# Patient Record
Sex: Male | Born: 1985 | Race: Black or African American | Hispanic: No | Marital: Single | Smoking: Former smoker
Health system: Southern US, Community
[De-identification: ages and names within clinical notes are randomized; demographics above are authoritative.]

## PROBLEM LIST (undated history)

## (undated) DIAGNOSIS — K219 Gastro-esophageal reflux disease without esophagitis: Secondary | ICD-10-CM

## (undated) DIAGNOSIS — F319 Bipolar disorder, unspecified: Secondary | ICD-10-CM

## (undated) DIAGNOSIS — Z8489 Family history of other specified conditions: Secondary | ICD-10-CM

## (undated) DIAGNOSIS — R569 Unspecified convulsions: Secondary | ICD-10-CM

## (undated) HISTORY — PX: TOOTH EXTRACTION: SUR596

---
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
## 2011-08-02 ENCOUNTER — Emergency Department (HOSPITAL_COMMUNITY)
Admission: EM | Admit: 2011-08-02 | Discharge: 2011-08-02 | Disposition: A | Discharge status: Home / Self Care | Payer: Self-pay | Attending: Emergency Medicine | Admitting: Emergency Medicine

## 2011-08-02 ENCOUNTER — Encounter (HOSPITAL_COMMUNITY): Payer: Self-pay | Admitting: Emergency Medicine

## 2011-08-02 DIAGNOSIS — S0181XA Laceration without foreign body of other part of head, initial encounter: Secondary | ICD-10-CM

## 2011-08-02 DIAGNOSIS — S0180XA Unspecified open wound of other part of head, initial encounter: Secondary | ICD-10-CM | POA: Insufficient documentation

## 2011-08-02 DIAGNOSIS — Y92009 Unspecified place in unspecified non-institutional (private) residence as the place of occurrence of the external cause: Secondary | ICD-10-CM | POA: Insufficient documentation

## 2011-08-02 DIAGNOSIS — W1809XA Striking against other object with subsequent fall, initial encounter: Secondary | ICD-10-CM | POA: Insufficient documentation

## 2011-08-02 MED ORDER — LIDOCAINE-EPINEPHRINE 2 %-1:100000 IJ SOLN
20.0000 mL | Freq: Once | INTRAMUSCULAR | Status: AC
  Administered 2011-08-02: 1 mL via INTRADERMAL

## 2011-08-02 NOTE — ED Notes (Signed)
Pressure applied to 1 cm facial laceration

## 2011-08-02 NOTE — ED Provider Notes (Signed)
History     CSN: [id number]  Arrival date & time 08/02/11  1351   First MD Initiated Contact with Patient 08/02/11 1424      Chief Complaint  Patient presents with  . Facial Laceration    1 cm laceration to area below outer aspect of l/eye. top of cheek. Bleeding controlled with pressure. Pt fell at home one hour ago    (Consider location/radiation/quality/duration/timing/severity/associated sxs/prior treatment) HPI Comments: Patient reports that he tripped and fell against the edge of a door just prior to arrival.  He now has a laceration lateral to his left eye.  He denies headache.  No LOC with the fall.  No nausea or vomiting.  No other symptoms.    Patient is a 26 y.o. male presenting with skin laceration. The history is provided by the patient.  Laceration  The incident occurred 1 to 2 hours ago. The laceration is located on the face. Size: 1.5 cm. The pain is mild. He reports no foreign bodies present. His tetanus status is UTD.    History reviewed. No pertinent past medical history.  History reviewed. No pertinent past surgical history.  Family History  Problem Relation Age of Onset  . Diabetes Mother   . Hypertension Mother   . Diabetes Father   . Hypertension Father     History  Substance Use Topics  . Smoking status: Current Everyday Smoker  . Smokeless tobacco: Not on file  . Alcohol Use: Yes      Review of Systems  Constitutional: Negative for fever and chills.  Eyes: Negative for pain, redness and visual disturbance.  Neurological: Negative for dizziness, syncope, light-headedness and headaches.    Allergies  Augmentin  Home Medications   Current Outpatient Rx  Name Route Sig Dispense Refill  . NAPROXEN SODIUM 220 MG PO TABS Oral Take 220 mg by mouth 2 (two) times daily with a meal.      BP 118/55  Pulse 79  Temp(Src) 98.6 F (37 C) (Oral)  Resp 16  SpO2 100%  Physical Exam  Nursing note and vitals reviewed. Constitutional: He is  oriented to person, place, and time. He appears well-developed and well-nourished. No distress.  HENT:  Head:    Eyes: EOM are normal. Pupils are equal, round, and reactive to light.  Neck: Normal range of motion. Neck supple.  Cardiovascular: Normal rate, regular rhythm and normal heart sounds.   Pulmonary/Chest: Effort normal and breath sounds normal.  Neurological: He is alert and oriented to person, place, and time. No cranial nerve deficit.  Skin: Skin is warm and dry. He is not diaphoretic.  Psychiatric: He has a normal mood and affect.    ED Course  Procedures (including critical care time)  Labs Reviewed - No data to display No results found.   No diagnosis found.  LACERATION REPAIR Performed by: Anne Shutter, Jaquitta Dupriest Authorized by: Anne Shutter, Herbert Seta Consent: Verbal consent obtained. Risks and benefits: risks, benefits and alternatives were discussed Consent given by: patient Patient identity confirmed: provided demographic data Prepped and Draped in normal sterile fashion Wound explored  Laceration Location:   Laceration Length: 1.5 cm No Foreign Bodies seen or palpated  Anesthesia: local infiltration  Local anesthetic: lidocaine 2% with epinephrine  Anesthetic total: 2 ml  Irrigation method: syringe Amount of cleaning: standard  Skin closure: 6-0 nylon  Number of sutures: 3  Technique: simple interrupted  Patient tolerance: Patient tolerated the procedure well with no immediate complications.  MDM  Laceration repaired  without complications.  CN II-XII intact.  Patient instructed to follow up in 4-5 days for suture removal.        Magnus Sinning, PA-C 08/02/11 1636

## 2011-08-04 NOTE — ED Provider Notes (Signed)
Medical screening examination/treatment/procedure(s) were performed by non-physician practitioner and as supervising physician I was immediately available for consultation/collaboration.   Forbes Cellar, MD 08/04/11 1336

## 2012-06-21 ENCOUNTER — Emergency Department (HOSPITAL_BASED_OUTPATIENT_CLINIC_OR_DEPARTMENT_OTHER)
Admission: EM | Admit: 2012-06-21 | Discharge: 2012-06-21 | Disposition: A | Discharge status: Home / Self Care | Payer: Self-pay | Attending: Emergency Medicine | Admitting: Emergency Medicine

## 2012-06-21 ENCOUNTER — Encounter (HOSPITAL_BASED_OUTPATIENT_CLINIC_OR_DEPARTMENT_OTHER): Payer: Self-pay | Admitting: *Deleted

## 2012-06-21 DIAGNOSIS — R079 Chest pain, unspecified: Secondary | ICD-10-CM

## 2012-06-21 DIAGNOSIS — Z720 Tobacco use: Secondary | ICD-10-CM

## 2012-06-21 DIAGNOSIS — R062 Wheezing: Secondary | ICD-10-CM | POA: Insufficient documentation

## 2012-06-21 DIAGNOSIS — Z79899 Other long term (current) drug therapy: Secondary | ICD-10-CM | POA: Insufficient documentation

## 2012-06-21 DIAGNOSIS — Z716 Tobacco abuse counseling: Secondary | ICD-10-CM

## 2012-06-21 DIAGNOSIS — K219 Gastro-esophageal reflux disease without esophagitis: Secondary | ICD-10-CM | POA: Insufficient documentation

## 2012-06-21 DIAGNOSIS — F17200 Nicotine dependence, unspecified, uncomplicated: Secondary | ICD-10-CM | POA: Insufficient documentation

## 2012-06-21 DIAGNOSIS — R0789 Other chest pain: Secondary | ICD-10-CM | POA: Insufficient documentation

## 2012-06-21 DIAGNOSIS — Z7189 Other specified counseling: Secondary | ICD-10-CM | POA: Insufficient documentation

## 2012-06-21 MED ORDER — ALUM & MAG HYDROXIDE-SIMETH 200-200-20 MG/5ML PO SUSP
30.0000 mL | Freq: Once | ORAL | Status: AC
  Administered 2012-06-21: 30 mL via ORAL
  Filled 2012-06-21: qty 30

## 2012-06-21 MED ORDER — PANTOPRAZOLE SODIUM 40 MG PO TBEC
40.0000 mg | DELAYED_RELEASE_TABLET | Freq: Once | ORAL | Status: AC
  Administered 2012-06-21: 40 mg via ORAL
  Filled 2012-06-21: qty 1

## 2012-06-21 MED ORDER — OMEPRAZOLE 20 MG PO CPDR: 20.0000 mg | DELAYED_RELEASE_CAPSULE | Freq: Two times a day (BID) | ORAL | Status: DC

## 2012-06-21 MED ORDER — CALCIUM CARBONATE ANTACID 500 MG PO CHEW: 2.0000 | CHEWABLE_TABLET | Freq: Every day | ORAL | Status: DC

## 2012-06-21 NOTE — ED Provider Notes (Signed)
History     CSN: [id number]  Arrival date & time 06/21/12  1437   First MD Initiated Contact with Patient 06/21/12 1505      Chief Complaint  Patient presents with  . Pleurisy    (Consider location/radiation/quality/duration/timing/severity/associated sxs/prior treatment) HPI Todd Yoder is a 26 y.o. male presenting with left sided chest pressure for one year.  It has worsened acutely over last 3-4 days. Mother and pt say he has had increasing intolerance to certain foods like "pepper".  Wakes with pain in am, and has sour taste in mouth also.  Daily cigarette smoker.  Pressure type pain of 6-7/10 recently is relieved bty belching and eating.  He has been having some "racing of my heart" - "could feel all four of mychambers!" Denies dyspnea, diaphoresis, nausea, vomiting, diarrhea, abdominal pain, dizziness, syncope, fever, chills, headache, sinus pain or pressure, rash, sore throat.  Pt is a singer.  History reviewed. No pertinent past medical history.  History reviewed. No pertinent past surgical history.  Family History  Problem Relation Age of Onset  . Diabetes Mother   . Hypertension Mother   . Diabetes Father   . Hypertension Father     History  Substance Use Topics  . Smoking status: Current Every Day Smoker  . Smokeless tobacco: Not on file  . Alcohol Use: Yes      Review of Systems At least 10pt or greater review of systems completed and are negative except where specified in the HPI.  Allergies  Amoxicillin-pot clavulanate  Home Medications   Current Outpatient Rx  Name  Route  Sig  Dispense  Refill  . NAPROXEN SODIUM 220 MG PO TABS   Oral   Take 220 mg by mouth 2 (two) times daily with a meal.           BP 149/96  Pulse 84  Temp 98.8 F (37.1 C) (Oral)  Resp 16  Ht 6\' 4"  (1.93 m)  Wt 208 lb (94.348 kg)  BMI 25.32 kg/m2  SpO2 100%  Physical Exam  Nursing notes reviewed.  Electronic medical record reviewed. VITAL SIGNS:   Filed  Vitals:   06/21/12 1449  BP: 149/96  Pulse: 84  Temp: 98.8 F (37.1 C)  TempSrc: Oral  Resp: 16  Height: 6\' 4"  (1.93 m)  Weight: 208 lb (94.348 kg)  SpO2: 100%   CONSTITUTIONAL: Awake, oriented, appears non-toxic HENT: Atraumatic, normocephalic, oral mucosa pink and moist, airway patent. Nares patent without drainage. External ears normal, TM's clear BL. EYES: Conjunctiva clear, EOMI, PERRLA NECK: Trachea midline, non-tender, supple CARDIOVASCULAR: Normal heart rate, Normal rhythm, No murmurs, rubs, gallops PULMONARY/CHEST: Clear to auscultation, no rhonchi, or rales. Faint end-expiratory wheezes bilaterally. Symmetrical breath sounds. Non-tender. ABDOMINAL: Non-distended, soft, non-tender - no rebound or guarding.  BS normal. NEUROLOGIC: Non-focal, moving all four extremities, no gross sensory or motor deficits. EXTREMITIES: No clubbing, cyanosis, or edema SKIN: Warm, Dry, No erythema, No rash  ED Course  Procedures (including critical care time)  Date: 06/21/2012  Rate: 85  Rhythm: sinus rhythm w/ sinus arrhythmia  QRS Axis: normal  Intervals: normal  ST/T Wave abnormalities: normal  Conduction Disutrbances: none  Narrative Interpretation: unremarkable, no significant change when compared with prior version 05/07/2003   Labs Reviewed - No data to display No results found.   1. GERD (gastroesophageal reflux disease)   2. Tobacco use   3. Chest pain   4. Wheezes   5. Encounter for smoking cessation counseling  MDM  Todd Yoder is a 26 y.o. male presenting with chest/pain pressure w/ overall clinical presentation c/w GERD, possibly early gastric ulcer.  Does use naproxen also.  Doubt ACS, PERC negative, no PTX, VSS/WNL, pt is non-toxic and in NAD.    Had extensive 10 minute discussion concerning quitting smoking (patient has no PCP and will likely not get one) discussed options for nicotine replacement and things to expect for nicotine withdrawal.  Discussed  strategies to increase his likelihood of quitting smoking.  Will put pt on omeprazole BID, and Tums PRN, I have told him his choice of smoking is likely causing these problems and that he must quit. Follow up with GI in 4-6 weeks, PCP resources given.  I explained the diagnosis and have given explicit precautions to return to the ER including worsening pain or any other new or worsening symptoms. The patient understands and accepts the medical plan as it's been dictated and I have answered their questions. Discharge instructions concerning home care and prescriptions have been given.  The patient is STABLE and is discharged to home in good condition.         Jones Skene, MD 06/21/12 1541

## 2012-06-21 NOTE — ED Notes (Signed)
Pt requesting an albuterol inhaler, Dr. Rulon Abide verbal order to [facility] pharmacy for inhaler.

## 2012-06-21 NOTE — ED Notes (Signed)
MD at bedside. 

## 2012-06-21 NOTE — ED Notes (Signed)
Pt amb to triage with quick steady gait in nad. Pt reports chest soreness x 1 year, worse x last night.

## 2013-12-15 ENCOUNTER — Emergency Department (HOSPITAL_COMMUNITY)
Admission: EM | Admit: 2013-12-15 | Discharge: 2013-12-16 | Disposition: A | Discharge status: Home / Self Care | Payer: BC Managed Care – PPO | Attending: Emergency Medicine | Admitting: Emergency Medicine

## 2013-12-15 DIAGNOSIS — F17200 Nicotine dependence, unspecified, uncomplicated: Secondary | ICD-10-CM | POA: Insufficient documentation

## 2013-12-15 DIAGNOSIS — M25539 Pain in unspecified wrist: Secondary | ICD-10-CM

## 2013-12-15 DIAGNOSIS — IMO0001 Reserved for inherently not codable concepts without codable children: Secondary | ICD-10-CM | POA: Insufficient documentation

## 2013-12-15 DIAGNOSIS — K219 Gastro-esophageal reflux disease without esophagitis: Secondary | ICD-10-CM | POA: Insufficient documentation

## 2013-12-15 DIAGNOSIS — Z79899 Other long term (current) drug therapy: Secondary | ICD-10-CM | POA: Insufficient documentation

## 2013-12-15 DIAGNOSIS — Z880 Allergy status to penicillin: Secondary | ICD-10-CM | POA: Insufficient documentation

## 2013-12-15 HISTORY — DX: Gastro-esophageal reflux disease without esophagitis: K21.9

## 2013-12-15 NOTE — ED Notes (Signed)
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  Pt reports having pain and limited movement in right wrist for the past 2 weeks. Pt right wrist is swollen but has a +2 radial pulse on the injured extremity. Pt denies loss of sensation or numbness.

## 2013-12-16 ENCOUNTER — Encounter (HOSPITAL_COMMUNITY): Payer: Self-pay | Admitting: Emergency Medicine

## 2013-12-16 ENCOUNTER — Emergency Department (HOSPITAL_COMMUNITY): Payer: BC Managed Care – PPO

## 2013-12-16 MED ORDER — TRAMADOL HCL 50 MG PO TABS: 50.0000 mg | ORAL_TABLET | Freq: Four times a day (QID) | ORAL | Status: DC | PRN

## 2013-12-16 MED ORDER — NAPROXEN 500 MG PO TABS: 500.0000 mg | ORAL_TABLET | Freq: Two times a day (BID) | ORAL | Status: DC

## 2013-12-16 MED ORDER — HYDROCODONE-ACETAMINOPHEN 5-325 MG PO TABS
2.0000 | ORAL_TABLET | Freq: Once | ORAL | Status: AC
  Administered 2013-12-16: 2 via ORAL
  Filled 2013-12-16: qty 2

## 2013-12-16 NOTE — ED Provider Notes (Signed)
CSN: [id number]     Arrival date & time 12/15/13  2325 History   First MD Initiated Contact with Patient 12/16/13 0008     Chief Complaint  Patient presents with  . Wrist Pain    (Consider location/radiation/quality/duration/timing/severity/associated sxs/prior Treatment) HPI Comments: 28 year old with complaints of right wrist pain x2 weeks. Patient states that he does a lot of lifting at his job at [employer]. 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 No direct trauma or injury to the area.  Patient is a 28 y.o. male presenting with wrist pain. The history is provided by the patient. No language interpreter was used.  Wrist Pain This is a new problem. Episode onset: 2 weeks ago. The problem occurs constantly. The problem has been gradually worsening. Associated symptoms include arthralgias and myalgias. Pertinent negatives include no fever, numbness or weakness. Exacerbated by: movement, lifting, palpation. He has tried NSAIDs for the symptoms. The treatment provided mild relief.    Past Medical History  Diagnosis Date  . GERD (gastroesophageal reflux disease)    Past Surgical History  Procedure Laterality Date  . Tooth extraction     Family History  Problem Relation Age of Onset  . Diabetes Mother   . Hypertension Mother   . Diabetes Father   . Hypertension Father    History  Substance Use Topics  . Smoking status: Current Every Day Smoker -- 0.25 packs/day    Types: Cigarettes  . Smokeless tobacco: Not on file  . Alcohol Use: Yes     Comment: occasional    Review of Systems  Constitutional: Negative for fever.  Musculoskeletal: Positive for arthralgias and myalgias.  Neurological: Negative for weakness and numbness.  All other systems reviewed and are negative.     Allergies  Amoxicillin-pot clavulanate  Home Medications   Prior to Admission medications   Medication Sig Start Date End  Date Taking? Authorizing Provider  calcium carbonate (TUMS) 500 MG chewable tablet Chew 2 tablets (400 mg of elemental calcium total) by mouth daily. 06/21/12   John-Adam Bonk, MD  naproxen (NAPROSYN) 500 MG tablet Take 1 tablet (500 mg total) by mouth 2 (two) times daily. 12/16/13   Antony MaduraKelly Dijon Cosens, PA-C  naproxen sodium (ANAPROX) 220 MG tablet Take 220 mg by mouth 2 (two) times daily with a meal.    Historical Provider, MD  omeprazole (PRILOSEC) 20 MG capsule Take 1 capsule (20 mg total) by mouth 2 (two) times daily. 06/21/12   John-Adam Bonk, MD  traMADol (ULTRAM) 50 MG tablet Take 1 tablet (50 mg total) by mouth every 6 (six) hours as needed. 12/16/13   Antony MaduraKelly Monifa Blanchette, PA-C   BP 119/77  Pulse 65  Temp(Src) 97.9 F (36.6 C) (Oral)  Resp 20  SpO2 98%  Physical Exam  Nursing note and vitals reviewed. Constitutional: He is oriented to person, place, and time. He appears well-developed and well-nourished. No distress.  Nontoxic/nonseptic appearing  HENT:  Head: Normocephalic and atraumatic.  Eyes: Conjunctivae and EOM are normal. No scleral icterus.  Neck: Normal range of motion.  Cardiovascular: Normal rate, regular rhythm and intact distal pulses.   Distal radial pulse 2+ and right upper extremity. Capillary refill normal in all digits of right hand.  Pulmonary/Chest: Effort normal. No respiratory distress.  Musculoskeletal: Normal range of motion. He exhibits tenderness.  Tenderness to palpation of the right wrist extending to the right thumb. Full passive range  of motion of right wrist. No swelling, crepitus, deformity, or effusions.  Neurological: He is alert and oriented to person, place, and time.  No numbness, tingling or weakness of the affected extremity. Sensation to light touch intact. Patient able to wiggle all fingers. Finger to thumb opposition intact.  Skin: Skin is warm and dry. No rash noted. He is not diaphoretic. No erythema. No pallor.  Psychiatric: He has a normal mood and  affect. His behavior is normal.    ED Course  Procedures (including critical care time) Labs Review Labs Reviewed - No data to display  Imaging Review Dg Wrist Complete Right  12/16/2013   CLINICAL DATA:  No known injury. Heavy lifting. Pain and swelling along the radial side of the wrist.  EXAM: RIGHT WRIST - COMPLETE 3+ VIEW  COMPARISON:  None.  FINDINGS: There is no evidence of fracture or dislocation. There is no evidence of arthropathy or other focal bone abnormality. Soft tissues are unremarkable.  IMPRESSION: Negative.   Electronically Signed   By: Burman Nieves M.D.   On: 12/16/2013 00:49     EKG Interpretation None      MDM   Final diagnoses:  Wrist pain    Uncomplicated right wrist pain. Suspect strain versus tendinitis. Patient neurovascularly intact in physical exam. No gross sensory deficits appreciated. X-ray negative for fracture or dislocation. No evidence of septic joint. Thumb spica splint applied and RICE instructions provided. Will refer to hand specialist for further workup as needed. Return precautions provided and patient agreeable to plan with no unaddressed concerns.  Filed Vitals:   12/15/13 2352  BP: 119/77  Pulse: 65  Temp: 97.9 F (36.6 C)  TempSrc: Oral  Resp: 20  SpO2: 98%       Antony Madura, PA-C 12/16/13 0106

## 2013-12-16 NOTE — Discharge Instructions (Signed)
RICE: Routine Care for Injuries The routine care of many injuries includes Rest, Ice, Compression, and Elevation (RICE). HOME CARE INSTRUCTIONS  Rest is needed to allow your body to heal. Routine activities can usually be resumed when comfortable. Injured tendons and bones can take up to 6 weeks to heal. Tendons are the cord-like structures that attach muscle to bone.  Ice following an injury helps keep the swelling down and reduces pain.  Put ice in a plastic bag.  Place a towel between your skin and the bag.  Leave the ice on for 15-20 minutes, 03-04 times a day. Do this while awake, for the first 24 to 48 hours. After that, continue as directed by your caregiver.  Compression helps keep swelling down. It also gives support and helps with discomfort. If an elastic bandage has been applied, it should be removed and reapplied every 3 to 4 hours. It should not be applied tightly, but firmly enough to keep swelling down. Watch fingers or toes for swelling, bluish discoloration, coldness, numbness, or excessive pain. If any of these problems occur, remove the bandage and reapply loosely. Contact your caregiver if these problems continue.  Elevation helps reduce swelling and decreases pain. With extremities, such as the arms, hands, legs, and feet, the injured area should be placed near or above the level of the heart, if possible. SEEK IMMEDIATE MEDICAL CARE IF:  You have persistent pain and swelling.  You develop redness, numbness, or unexpected weakness.  Your symptoms are getting worse rather than improving after several days. These symptoms may indicate that further evaluation or further X-rays are needed. Sometimes, X-rays may not show a small broken bone (fracture) until 1 week or 10 days later. Make a follow-up appointment with your caregiver. Ask when your X-ray results will be ready. Make sure you get your X-ray results. Document Released: 10/09/2000 Document Revised: 09/19/2011  Document Reviewed: 11/26/2010 Exit[employer] Patient Information 2014 [employer].  

## 2013-12-27 NOTE — ED Provider Notes (Signed)
Medical screening examination/treatment/procedure(s) were performed by non-physician practitioner and as supervising physician I was immediately available for consultation/collaboration.   EKG Interpretation None        Brandt LoosenJulie Manly, MD 12/27/13 [phone]

## 2014-04-02 ENCOUNTER — Emergency Department (HOSPITAL_COMMUNITY)
Admission: EM | Admit: 2014-04-02 | Discharge: 2014-04-02 | Discharge status: Against Medical Advice | Payer: BC Managed Care – PPO | Attending: Emergency Medicine | Admitting: Emergency Medicine

## 2014-04-02 ENCOUNTER — Encounter (HOSPITAL_COMMUNITY): Payer: Self-pay | Admitting: Emergency Medicine

## 2014-04-02 DIAGNOSIS — R079 Chest pain, unspecified: Secondary | ICD-10-CM | POA: Diagnosis present

## 2014-04-02 DIAGNOSIS — F17200 Nicotine dependence, unspecified, uncomplicated: Secondary | ICD-10-CM | POA: Insufficient documentation

## 2014-04-02 LAB — CBC
HCT: 42.1 % (ref 39.0–52.0)
Hemoglobin: 14.1 g/dL (ref 13.0–17.0)
MCH: 29.4 pg (ref 26.0–34.0)
MCHC: 33.5 g/dL (ref 30.0–36.0)
MCV: 87.7 fL (ref 78.0–100.0)
Platelets: 186 10*3/uL (ref 150–400)
RBC: 4.8 MIL/uL (ref 4.22–5.81)
RDW: 13.1 % (ref 11.5–15.5)
WBC: 5.6 10*3/uL (ref 4.0–10.5)

## 2014-04-02 LAB — I-STAT TROPONIN, ED: Troponin i, poc: 0 ng/mL (ref 0.00–0.08)

## 2014-04-02 LAB — BASIC METABOLIC PANEL
Anion gap: 11 (ref 5–15)
BUN: 16 mg/dL (ref 6–23)
CO2: 23 mEq/L (ref 19–32)
Calcium: 8.8 mg/dL (ref 8.4–10.5)
Chloride: 103 mEq/L (ref 96–112)
Creatinine, Ser: 1.15 mg/dL (ref 0.50–1.35)
GFR calc Af Amer: >90 mL/min (ref >90)
GFR calc non Af Amer: 87 mL/min — ABNORMAL LOW (ref >90)
Glucose, Bld: 77 mg/dL (ref 70–99)
Potassium: 4.2 mEq/L (ref 3.7–5.3)
Sodium: 137 mEq/L (ref 137–147)

## 2014-04-02 NOTE — ED Notes (Signed)
Bed: WA06 Expected date:  Expected time:  Means of arrival:  Comments: Pt still in rm

## 2014-04-02 NOTE — ED Notes (Signed)
Called for patient twice with no answer.

## 2014-04-02 NOTE — ED Notes (Signed)
Bed: WA07 Expected date:  Expected time:  Means of arrival:  Comments: Pr still in rm

## 2014-04-02 NOTE — ED Notes (Signed)
Called for pt, no response

## 2014-04-02 NOTE — ED Notes (Signed)
Called pt, no response x 2

## 2014-04-02 NOTE — ED Notes (Signed)
Pt reports he started having chest pain around 12pm after he smoked a cigarette. His mother gave him a muscle relaxer, pain was 8/10, now 5/10. Denies n/v/d.

## 2015-03-02 ENCOUNTER — Encounter (HOSPITAL_COMMUNITY): Payer: Self-pay

## 2015-03-02 ENCOUNTER — Emergency Department (HOSPITAL_COMMUNITY)
Admission: EM | Admit: 2015-03-02 | Discharge: 2015-03-02 | Disposition: A | Discharge status: Home / Self Care | Payer: BLUE CROSS/BLUE SHIELD | Attending: Physician Assistant | Admitting: Physician Assistant

## 2015-03-02 DIAGNOSIS — H109 Unspecified conjunctivitis: Secondary | ICD-10-CM | POA: Insufficient documentation

## 2015-03-02 DIAGNOSIS — Z880 Allergy status to penicillin: Secondary | ICD-10-CM | POA: Diagnosis not present

## 2015-03-02 DIAGNOSIS — H5711 Ocular pain, right eye: Secondary | ICD-10-CM | POA: Diagnosis present

## 2015-03-02 DIAGNOSIS — K219 Gastro-esophageal reflux disease without esophagitis: Secondary | ICD-10-CM | POA: Insufficient documentation

## 2015-03-02 DIAGNOSIS — Z720 Tobacco use: Secondary | ICD-10-CM | POA: Diagnosis not present

## 2015-03-02 DIAGNOSIS — Z791 Long term (current) use of non-steroidal anti-inflammatories (NSAID): Secondary | ICD-10-CM | POA: Diagnosis not present

## 2015-03-02 DIAGNOSIS — Z79899 Other long term (current) drug therapy: Secondary | ICD-10-CM | POA: Diagnosis not present

## 2015-03-02 MED ORDER — POLYMYXIN B-TRIMETHOPRIM 10000-0.1 UNIT/ML-% OP SOLN: 1.0000 [drp] | OPHTHALMIC | Status: DC

## 2015-03-02 NOTE — ED Notes (Signed)
Pt with eye pain and discharge x 1 week.  Pink eye outbreak at job

## 2015-03-02 NOTE — ED Provider Notes (Signed)
CSN: [id number]     Arrival date & time 03/02/15  1522 History  This chart was scribed for non-physician practitioner Sharilyn Sites, PA-C working with Abelino Derrick, MD by Littie Deeds, ED Scribe. This patient was seen in room WTR7/WTR7 and the patient's care was started at 4:22 PM.      Chief Complaint  Patient presents with  . Eye Pain   The history is provided by the patient. No language interpreter was used.   HPI Comments: Todd Yoder is a 29 y.o. male who presents to the Emergency Department complaining of right eye pain and purulent drainage for the past week.  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 He denies any acute visual disturbance.  Patient does not wear glasses or contact lenses. Patient has been cleaning eyes with cold cloths at home.  VSS.  Past Medical History  Diagnosis Date  . GERD (gastroesophageal reflux disease)    Past Surgical History  Procedure Laterality Date  . Tooth extraction     Family History  Problem Relation Age of Onset  . Diabetes Mother   . Hypertension Mother   . Diabetes Father   . Hypertension Father    Social History  Substance Use Topics  . Smoking status: Current Every Day Smoker -- 0.25 packs/day    Types: Cigarettes  . Smokeless tobacco: None  . Alcohol Use: Yes     Comment: occasional    Review of Systems  Eyes: Positive for pain and discharge.  All other systems reviewed and are negative.     Allergies  Amoxicillin-pot clavulanate  Home Medications   Prior to Admission medications   Medication Sig Start Date End Date Taking? Authorizing Provider  calcium carbonate (TUMS) 500 MG chewable tablet Chew 2 tablets (400 mg of elemental calcium total) by mouth daily. 06/21/12   John-Adam Bonk, MD  naproxen (NAPROSYN) 500 MG tablet Take 1 tablet (500 mg total) by mouth 2 (two) times daily. 12/16/13   Antony Madura, PA-C  naproxen sodium (ANAPROX) 220 MG tablet Take 220 mg by mouth 2 (two)  times daily with a meal.    Historical Provider, MD  omeprazole (PRILOSEC) 20 MG capsule Take 1 capsule (20 mg total) by mouth 2 (two) times daily. 06/21/12   John-Adam Bonk, MD  traMADol (ULTRAM) 50 MG tablet Take 1 tablet (50 mg total) by mouth every 6 (six) hours as needed. 12/16/13   Antony Madura, PA-C   BP 151/88 mmHg  Pulse 65  Temp(Src) 98.8 F (37.1 C) (Oral)  Resp 18  SpO2 100%   Physical Exam  Constitutional: He is oriented to person, place, and time. He appears well-developed and well-nourished. No distress.  HENT:  Head: Normocephalic and atraumatic.  Mouth/Throat: Oropharynx is clear and moist.  Eyes: EOM are normal. Pupils are equal, round, and reactive to light. Right conjunctiva is injected.  Slight upper and lower eyelid edema; right conjunctiva injected with crusting noted along upper and lower lash lines; EOMs intact, non-painful  Neck: Normal range of motion. Neck supple.  Cardiovascular: Normal rate, regular rhythm and normal heart sounds.   Pulmonary/Chest: Effort normal and breath sounds normal. No respiratory distress. He has no wheezes.  Musculoskeletal: Normal range of motion.  Neurological: He is alert and oriented to person, place, and time.  Skin: Skin is warm and dry. He is not diaphoretic.  Psychiatric: He has a normal mood and affect.  Nursing note and vitals reviewed.  ED Course  Procedures  DIAGNOSTIC STUDIES: Oxygen Saturation is 100% on room air, normal by my interpretation.    COORDINATION OF CARE: 4:23 PM-Discussed treatment plan which includes antibiotics with patient/guardian at bedside and patient/guardian agreed to plan.    Labs Review Labs Reviewed - No data to display  Imaging Review No results found. I have personally reviewed and evaluated these images and lab results as part of my medical decision-making.   EKG Interpretation None      MDM   Final diagnoses:  Conjunctivitis of right eye   29 year old male here with  right eye pain and drainage for the past week. Known contacts with pinkeye. Patient appears to have the same. No concerning findings for pre-septal or orbital cellulitis.  Will start on course of Polytrim drops. He was given ophthalmology follow-up for any new or worsening symptoms.  Discussed plan with patient, he/she acknowledged understanding and agreed with plan of care.  Return precautions given for new or worsening symptoms.  I personally performed the services described in this documentation, which was scribed in my presence. The recorded information has been reviewed and is accurate.  Garlon Hatchet, PA-C 03/02/15 1641  Courteney Randall An, MD 03/02/15 [phone]

## 2015-03-02 NOTE — Discharge Instructions (Signed)
Take the prescribed medication as directed.  If you begin having any symptoms in the left eye, start using drops on that eye too. Follow-up with eye doctor for any new/worsening symptoms.

## 2015-09-20 ENCOUNTER — Encounter (HOSPITAL_COMMUNITY): Payer: Self-pay | Admitting: Emergency Medicine

## 2015-09-20 ENCOUNTER — Emergency Department (HOSPITAL_COMMUNITY)
Admission: EM | Admit: 2015-09-20 | Discharge: 2015-09-20 | Disposition: A | Discharge status: Home / Self Care | Payer: BLUE CROSS/BLUE SHIELD | Attending: Emergency Medicine | Admitting: Emergency Medicine

## 2015-09-20 DIAGNOSIS — B349 Viral infection, unspecified: Secondary | ICD-10-CM

## 2015-09-20 DIAGNOSIS — Z880 Allergy status to penicillin: Secondary | ICD-10-CM | POA: Insufficient documentation

## 2015-09-20 DIAGNOSIS — Z8719 Personal history of other diseases of the digestive system: Secondary | ICD-10-CM | POA: Insufficient documentation

## 2015-09-20 DIAGNOSIS — F17210 Nicotine dependence, cigarettes, uncomplicated: Secondary | ICD-10-CM | POA: Insufficient documentation

## 2015-09-20 LAB — URINE MICROSCOPIC-ADD ON
Bacteria, UA: NONE SEEN
RBC / HPF: NONE SEEN RBC/hpf (ref 0–5)
WBC, UA: NONE SEEN WBC/hpf (ref 0–5)

## 2015-09-20 LAB — COMPREHENSIVE METABOLIC PANEL
ALT: 19 U/L (ref 17–63)
AST: 27 U/L (ref 15–41)
Albumin: 3.8 g/dL (ref 3.5–5.0)
Alkaline Phosphatase: 53 U/L (ref 38–126)
Anion gap: 10 (ref 5–15)
BUN: 15 mg/dL (ref 6–20)
CO2: 21 mmol/L — ABNORMAL LOW (ref 22–32)
Calcium: 8.8 mg/dL — ABNORMAL LOW (ref 8.9–10.3)
Chloride: 106 mmol/L (ref 101–111)
Creatinine, Ser: 0.88 mg/dL (ref 0.61–1.24)
GFR calc Af Amer: >60 mL/min (ref >60)
GFR calc non Af Amer: >60 mL/min (ref >60)
Glucose, Bld: 111 mg/dL — ABNORMAL HIGH (ref 65–99)
Potassium: 3.9 mmol/L (ref 3.5–5.1)
Sodium: 137 mmol/L (ref 135–145)
Total Bilirubin: 0.3 mg/dL (ref 0.3–1.2)
Total Protein: 7.2 g/dL (ref 6.5–8.1)

## 2015-09-20 LAB — CBC
HCT: 48.7 % (ref 39.0–52.0)
Hemoglobin: 16 g/dL (ref 13.0–17.0)
MCH: 29 pg (ref 26.0–34.0)
MCHC: 32.9 g/dL (ref 30.0–36.0)
MCV: 88.2 fL (ref 78.0–100.0)
Platelets: 144 10*3/uL — ABNORMAL LOW (ref 150–400)
RBC: 5.52 MIL/uL (ref 4.22–5.81)
RDW: 13.3 % (ref 11.5–15.5)
WBC: 10.3 10*3/uL (ref 4.0–10.5)

## 2015-09-20 LAB — URINALYSIS, ROUTINE W REFLEX MICROSCOPIC
Bilirubin Urine: NEGATIVE
Glucose, UA: NEGATIVE mg/dL
Hgb urine dipstick: NEGATIVE
Ketones, ur: 15 mg/dL — AB
Leukocytes, UA: NEGATIVE
Nitrite: NEGATIVE
Protein, ur: 100 mg/dL — AB
Specific Gravity, Urine: 1.038 — ABNORMAL HIGH (ref 1.005–1.030)
pH: 6.5 (ref 5.0–8.0)

## 2015-09-20 LAB — LIPASE, BLOOD: Lipase: 38 U/L (ref 11–51)

## 2015-09-20 MED ORDER — ONDANSETRON 4 MG PO TBDP: 4.0000 mg | ORAL_TABLET | Freq: Three times a day (TID) | ORAL | Status: DC | PRN

## 2015-09-20 MED ORDER — BENZONATATE 100 MG PO CAPS: 100.0000 mg | ORAL_CAPSULE | Freq: Three times a day (TID) | ORAL | Status: DC

## 2015-09-20 MED ORDER — ONDANSETRON 4 MG PO TBDP
4.0000 mg | ORAL_TABLET | Freq: Once | ORAL | Status: AC
  Administered 2015-09-20: 4 mg via ORAL
  Filled 2015-09-20: qty 1

## 2015-09-20 NOTE — ED Notes (Signed)
Per pt, states cold symptoms for 4 days-also having diarrhea and vomiting

## 2015-09-20 NOTE — ED Notes (Signed)
Awake. Verbally responsive. A/O x4. Resp even and unlabored. No audible adventitious breath sounds noted. ABC's intact.  

## 2015-09-20 NOTE — ED Notes (Addendum)
Pt reported abd discomfort, n/v x2 and diarrhea x6 in past 24hrs, fever (100.0) and taken Motrin/Tylenol, nonproductive cough, denies GU symptoms

## 2015-09-20 NOTE — Discharge Instructions (Signed)
Viral Infections °A viral infection can be caused by different types of viruses. Most viral infections are not serious and resolve on their own. However, some infections may cause severe symptoms and may lead to further complications. °SYMPTOMS °Viruses can frequently cause: °· Minor sore throat. °· Aches and pains. °· Headaches. °· Runny nose. °· Different types of rashes. °· Watery eyes. °· Tiredness. °· Cough. °· Loss of appetite. °· Gastrointestinal infections, resulting in nausea, vomiting, and diarrhea. °These symptoms do not respond to antibiotics because the infection is not caused by bacteria. However, you might catch a bacterial infection following the viral infection. This is sometimes called a "superinfection." Symptoms of such a bacterial infection may include: °· Worsening sore throat with pus and difficulty swallowing. °· Swollen neck glands. °· Chills and a high or persistent fever. °· Severe headache. °· Tenderness over the sinuses. °· Persistent overall ill feeling (malaise), muscle aches, and tiredness (fatigue). °· Persistent cough. °· Yellow, green, or brown mucus production with coughing. °HOME CARE INSTRUCTIONS  °· Only take over-the-counter or prescription medicines for pain, discomfort, diarrhea, or fever as directed by your caregiver. °· Drink enough water and fluids to keep your urine clear or pale yellow. Sports drinks can provide valuable electrolytes, sugars, and hydration. °· Get plenty of rest and maintain proper nutrition. Soups and broths with crackers or rice are fine. °SEEK IMMEDIATE MEDICAL CARE IF:  °· You have severe headaches, shortness of breath, chest pain, neck pain, or an unusual rash. °· You have uncontrolled vomiting, diarrhea, or you are unable to keep down fluids. °· You or your child has an oral temperature above 102° F (38.9° C), not controlled by medicine. °· Your baby is older than 3 months with a rectal temperature of 102° F (38.9° C) or higher. °· Your baby is 3  months old or younger with a rectal temperature of 100.4° F (38° C) or higher. °MAKE SURE YOU:  °· Understand these instructions. °· Will watch your condition. °· Will get help right away if you are not doing well or get worse. °  °This information is not intended to replace advice given to you by your health care provider. Make sure you discuss any questions you have with your health care provider. °  °Document Released: 04/06/2005 Document Revised: 09/19/2011 Document Reviewed: 12/03/2014 °Elsevier Interactive Patient Education ©2016 [employer] ° °

## 2015-09-20 NOTE — ED Notes (Signed)
Pt given Ginger ale and tolerated well. Pt denies n/v at this time.

## 2015-09-20 NOTE — ED Provider Notes (Signed)
CSN: [id number]     Arrival date & time 09/20/15  1000 History   First MD Initiated Contact with Patient 09/20/15 1036     Chief Complaint  Patient presents with  . Emesis     (Consider location/radiation/quality/duration/timing/severity/associated sxs/prior Treatment) HPI Comments: 30 year old male who presents with cough, vomiting, and diarrhea. 4 days ago, the patient began having cold symptoms including productive cough, nasal congestion, sore throat, and subjective fevers. 2 days ago, he began having some vomiting and yesterday began having a little episodes of diarrhea. In taking ibuprofen at home for his fevers. He states that his last episode of vomiting was this morning when he choked on some mucus. 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  Patient is a 30 y.o. male presenting with vomiting. The history is provided by the patient.  Emesis   Past Medical History  Diagnosis Date  . GERD (gastroesophageal reflux disease)    Past Surgical History  Procedure Laterality Date  . Tooth extraction     Family History  Problem Relation Age of Onset  . Diabetes Mother   . Hypertension Mother   . Diabetes Father   . Hypertension Father    Social History  Substance Use Topics  . Smoking status: Current Every Day Smoker -- 0.25 packs/day    Types: Cigarettes  . Smokeless tobacco: None  . Alcohol Use: Yes     Comment: occasional    Review of Systems  Gastrointestinal: Positive for vomiting.    10 Systems reviewed and are negative for acute change except as noted in the HPI.   Allergies  Amoxicillin-pot clavulanate  Home Medications   Prior to Admission medications   Medication Sig Start Date End Date Taking? Authorizing Provider  ibuprofen (ADVIL,MOTRIN) 200 MG tablet Take 600 mg by mouth every 6 (six) hours as needed for moderate pain.   Yes Historical Provider, MD  benzonatate (TESSALON) 100 MG capsule Take 1 capsule (100 mg total) by mouth every 8  (eight) hours. 09/20/15   Laurence Spatesachel Morgan Little, MD  ondansetron (ZOFRAN ODT) 4 MG disintegrating tablet Take 1 tablet (4 mg total) by mouth every 8 (eight) hours as needed for nausea or vomiting. 09/20/15   Laurence Spatesachel Morgan Little, MD   BP 134/87 mmHg  Pulse 80  Temp(Src) 98.8 F (37.1 C) (Oral)  Resp 18  SpO2 98% Physical Exam  Constitutional: He is oriented to person, place, and time. He appears well-developed and well-nourished. No distress.  HENT:  Head: Normocephalic and atraumatic.  Nose: Nose normal.  Mouth/Throat: No oropharyngeal exudate.  Moist mucous membranes, erythema of posterior oropharynx   Eyes: Conjunctivae are normal. Pupils are equal, round, and reactive to light.  Neck: Neck supple.  Cardiovascular: Normal rate, regular rhythm and normal heart sounds.   No murmur heard. Pulmonary/Chest: Effort normal and breath sounds normal.  Occasional faint wheeze  Abdominal: Soft. Bowel sounds are normal. He exhibits no distension. There is no tenderness.  Musculoskeletal: He exhibits no edema.  Neurological: He is alert and oriented to person, place, and time.  Fluent speech  Skin: Skin is warm and dry.  Psychiatric: He has a normal mood and affect. Judgment normal.  Nursing note and vitals reviewed.   ED Course  Procedures (including critical care time) Labs Review Labs Reviewed  COMPREHENSIVE METABOLIC PANEL - Abnormal; Notable for the following:    CO2 21 (*)    Glucose, Bld 111 (*)    Calcium 8.8 (*)  All other components within normal limits  CBC - Abnormal; Notable for the following:    Platelets 144 (*)    All other components within normal limits  URINALYSIS, ROUTINE W REFLEX MICROSCOPIC (NOT AT [facility]) - Abnormal; Notable for the following:    Specific Gravity, Urine 1.038 (*)    Ketones, ur 15 (*)    Protein, ur 100 (*)    All other components within normal limits  URINE MICROSCOPIC-ADD ON - Abnormal; Notable for the following:    Squamous Epithelial /  LPF 0-5 (*)    All other components within normal limits  LIPASE, BLOOD    Imaging Review No results found. I have personally reviewed and evaluated these lab results as part of my medical decision-making.   EKG Interpretation None     Medications  ondansetron (ZOFRAN-ODT) disintegrating tablet 4 mg (4 mg Oral Given 09/20/15 1045)    MDM   Final diagnoses:  Viral syndrome   Pt p/w several days of cold symptoms as well as vomiting and diarrhea. He was well-appearing on exam with normal vital signs. Reassuring exam with no evidence of dehydration and no respiratory distress. Symptoms are consistent with a viral process. His lab work is unremarkable and shows normal WBC count. Gave the patient oral Zofran after which he was PO challenged. Discussed supportive care including continued hydration, Tylenol/Motrin as needed, and provided with prescription for Tessalon for his cough. Return precautions reviewed and patient discharged in satisfactory condition.  Laurence Spates, MD 09/20/15 1300

## 2015-09-28 ENCOUNTER — Encounter (HOSPITAL_BASED_OUTPATIENT_CLINIC_OR_DEPARTMENT_OTHER): Payer: Self-pay | Admitting: Emergency Medicine

## 2016-05-27 ENCOUNTER — Emergency Department (HOSPITAL_COMMUNITY)
Admission: EM | Admit: 2016-05-27 | Discharge: 2016-05-27 | Disposition: A | Discharge status: Home / Self Care | Payer: BLUE CROSS/BLUE SHIELD | Attending: Emergency Medicine | Admitting: Emergency Medicine

## 2016-05-27 ENCOUNTER — Encounter (HOSPITAL_COMMUNITY): Payer: Self-pay | Admitting: Emergency Medicine

## 2016-05-27 DIAGNOSIS — Y9302 Activity, running: Secondary | ICD-10-CM | POA: Insufficient documentation

## 2016-05-27 DIAGNOSIS — Y999 Unspecified external cause status: Secondary | ICD-10-CM | POA: Insufficient documentation

## 2016-05-27 DIAGNOSIS — Y929 Unspecified place or not applicable: Secondary | ICD-10-CM | POA: Insufficient documentation

## 2016-05-27 DIAGNOSIS — F17210 Nicotine dependence, cigarettes, uncomplicated: Secondary | ICD-10-CM | POA: Insufficient documentation

## 2016-05-27 DIAGNOSIS — Z79899 Other long term (current) drug therapy: Secondary | ICD-10-CM | POA: Insufficient documentation

## 2016-05-27 DIAGNOSIS — W268XXA Contact with other sharp object(s), not elsewhere classified, initial encounter: Secondary | ICD-10-CM | POA: Insufficient documentation

## 2016-05-27 DIAGNOSIS — S51811A Laceration without foreign body of right forearm, initial encounter: Secondary | ICD-10-CM | POA: Insufficient documentation

## 2016-05-27 MED ORDER — BACITRACIN ZINC 500 UNIT/GM EX OINT
TOPICAL_OINTMENT | Freq: Once | CUTANEOUS | Status: AC
  Administered 2016-05-27: 1 via TOPICAL
  Filled 2016-05-27: qty 0.9

## 2016-05-27 MED ORDER — LIDOCAINE-EPINEPHRINE (PF) 2 %-1:200000 IJ SOLN
20.0000 mL | Freq: Once | INTRAMUSCULAR | Status: AC
  Administered 2016-05-27: 20 mL
  Filled 2016-05-27: qty 20

## 2016-05-27 NOTE — Discharge Instructions (Signed)
[facility] to [facility]  ([address] [place] [id number] [phone]) in 10 days for suture removal and recheck of sutures for evidence of delayed wound closure or infection. Read attached documents for more information.  How long will I have my wound closure? Leave adhesive glue on your skin until the glue peels away. Leave adhesive strips on your skin until the strips fall off. Absorbable sutures will dissolve within several days. Nonabsorbable sutures and staples must be removed. The location of the wound will determine how long they stay in. This can range from several days to a couple of weeks. When should I seek help for my wound closure? Contact your health care provider if: You have a fever. You have chills. You have drainage, redness, swelling, or pain at your wound. There is a bad smell coming from your wound. The skin edges of your wound start to separate after your sutures have been removed. Your wound becomes thick, raised, and darker in color after your sutures come out (scarring).

## 2016-05-27 NOTE — ED Notes (Signed)
Bed: WTR7 Expected date:  Expected time:  Means of arrival:  Comments: 

## 2016-05-27 NOTE — ED Provider Notes (Signed)
[facility]-EMERGENCY DEPT Provider Note   CSN: [id number] Arrival date & time: 05/27/16  1200   By signing my name below, I, Todd Yoder, attest that this documentation has been prepared under the direction and in the presence of Sandee Bernath, [place]C. Electronically Signed: Teofilo PodMatthew P. Yoder, ED Scribe. 05/27/2016. 1:03 PM.   History   Chief Complaint Chief Complaint  Patient presents with  . Extremity Laceration   The history is provided by the patient. No language interpreter was used.   HPI Comments:  Todd Yoder is a 30 y.o. male who presents to the Emergency Department complaining of a laceration to his right forearm that he sustained at 1000 this morning. Pt reports that he was running up the stairs, fell, and cut his right forearm on a sharp part of the railing. Pt states that the laceration is "very deep." Pt does not believe that he lost a significant amount of blood. Pt states that the area is mildly painful. Tetanus is UTD. Pt did not clean the wound, but wrapped the wound with coban and a dish rag and was able to control the bleeding. Pt took ibuprofen with mild relief for pain. Pt denies chest pain, SOB, nausea, vomiting, chills.   Past Medical History:  Diagnosis Date  . GERD (gastroesophageal reflux disease)     There are no active problems to display for this patient.   Past Surgical History:  Procedure Laterality Date  . TOOTH EXTRACTION         Home Medications    Prior to Admission medications   Medication Sig Start Date End Date Taking? Authorizing Provider  benzonatate (TESSALON) 100 MG capsule Take 1 capsule (100 mg total) by mouth every 8 (eight) hours. 09/20/15   Laurence Spatesachel Morgan Little, MD  ibuprofen (ADVIL,MOTRIN) 200 MG tablet Take 600 mg by mouth every 6 (six) hours as needed for moderate pain.    Historical Provider, MD  ondansetron (ZOFRAN ODT) 4 MG disintegrating tablet Take 1 tablet (4 mg total) by mouth every 8 (eight) hours as needed  for nausea or vomiting. 09/20/15   Laurence Spatesachel Morgan Little, MD    Family History Family History  Problem Relation Age of Onset  . Diabetes Mother   . Hypertension Mother   . Diabetes Father   . Hypertension Father     Social History Social History  Substance Use Topics  . Smoking status: Current Every Day Smoker    Packs/day: 0.25    Types: Cigarettes  . Smokeless tobacco: Not on file  . Alcohol use Yes     Comment: occasional     Allergies   Amoxicillin-pot clavulanate   Review of Systems Review of Systems  Constitutional: Negative for chills and fever.  Respiratory: Negative for chest tightness and shortness of breath.   Cardiovascular: Negative for chest pain.  Gastrointestinal: Negative for abdominal distention, abdominal pain, nausea and vomiting.  Genitourinary: Negative for difficulty urinating.  Musculoskeletal: Negative for arthralgias and myalgias.  Skin: Positive for wound (right forearm).  All other systems reviewed and are negative.    Physical Exam Updated Vital Signs BP 162/92 (BP Location: Left Arm)   Pulse 73   Temp 98.7 F (37.1 C) (Oral)   Resp 18   Wt 93 kg   SpO2 100%   BMI 24.95 kg/m   Physical Exam  Constitutional: He is oriented to person, place, and time. He appears well-developed and well-nourished. No distress.  HENT:  Head: Normocephalic and atraumatic.  Eyes: Conjunctivae are  normal. Pupils are equal, round, and reactive to light.  Neck: Normal range of motion. Neck supple.  Cardiovascular: Normal rate.   Pulmonary/Chest: Effort normal and breath sounds normal.  Abdominal: He exhibits no distension.  Musculoskeletal: Normal range of motion.  Deep Laceration on right forearm  Neurological: He is alert and oriented to person, place, and time. No cranial nerve deficit.  Skin: Skin is warm and dry.  Psychiatric: He has a normal mood and affect. His behavior is normal.  Nursing note and vitals reviewed.    ED Treatments /  Results  DIAGNOSTIC STUDIES:  Oxygen Saturation is 100% on RA, normal by my interpretation.    COORDINATION OF CARE:  1:03 PM Discussed treatment plan with pt at bedside and pt agreed to plan.   Labs (all labs ordered are listed, but only abnormal results are displayed) Labs Reviewed - No data to display  EKG  EKG Interpretation None       Radiology No results found.  Procedures .Marland KitchenLaceration Repair Date/Time: 05/27/2016 2:54 PM Performed by: Alvina Chou Authorized by: Alvina Chou   Consent:    Consent obtained:  Verbal   Consent given by:  Patient   Risks discussed:  Infection, pain, poor wound healing and poor cosmetic result   Alternatives discussed:  No treatment Anesthesia (see MAR for exact dosages):    Anesthesia method:  Local infiltration   Local anesthetic:  Lidocaine 2% WITH epi Laceration details:    Location:  Shoulder/arm   Shoulder/arm location:  R lower arm   Length (cm):  10 Repair type:    Repair type:  Intermediate Pre-procedure details:    Preparation:  Patient was prepped and draped in usual sterile fashion Exploration:    Hemostasis achieved with:  Epinephrine   Wound exploration: wound explored through full range of motion and entire depth of wound probed and visualized     Wound extent: areolar tissue violated, fascia violated and muscle damage     Wound extent: no foreign bodies/material noted, no nerve damage noted, no tendon damage noted, no underlying fracture noted and no vascular damage noted     Contaminated: no   Treatment:    Area cleansed with:  Betadine and saline   Amount of cleaning:  Extensive   Irrigation solution:  Sterile saline   Irrigation volume:  2 L   Irrigation method:  Pressure wash   Visualized foreign bodies/material removed: no   Fascia repair:    Suture size:  5-0   Suture material:  Vicryl   Suture technique:  Simple interrupted   Number of sutures:  3 Skin repair:    Repair  method:  Sutures   Suture size:  4-0   Wound skin closure material used: Ethilon.   Suture technique:  Simple interrupted (7 simple interupted, 1 horizontal mattress)   Number of sutures:  8 (7 simple interupted, 1 horizontal mattress) Approximation:    Approximation:  Close   Vermilion border: well-aligned   Post-procedure details:    Dressing:  Antibiotic ointment and bulky dressing   Patient tolerance of procedure:  Tolerated well, no immediate complications   (including critical care time)  Medications Ordered in ED Medications  lidocaine-EPINEPHrine (XYLOCAINE W/EPI) 2 %-1:200000 (PF) injection 20 mL (not administered)  bacitracin ointment (not administered)     Initial Impression / Assessment and Plan / ED Course  I have reviewed the triage vital signs and the nursing notes.  Pertinent labs & imaging results that were  available during my care of the patient were reviewed by me and considered in my medical decision making (see chart for details).  Clinical Course   Patient is a 30y/o male that presents with laceration to right forearm. Tdap booster UTD. Pressure irrigation performed. Bottom of the wound visualized with bleeding controled. Laceration occurred < 8 hours prior to repair which was well tolerated. Pt has no co morbidities to effect normal wound healing. Discussed suture home care w pt and answered questions. Pt to f-u for wound check and suture removal in 10 days. Pt is hemodynamically stable w no complaints prior to dc.   BP improved to 134/94 at discharge.    Final Clinical Impressions(s) / ED Diagnoses   Final diagnoses:  Laceration of right forearm, initial encounter    New Prescriptions New Prescriptions   No medications on file  I personally performed the services described in this documentation, which was scribed in my presence. The recorded information has been reviewed and is accurate.    [address]Alesana Magistro Manuel [place], [place] 05/27/16 1534    Gerhard Munchobert  Lockwood, MD 05/27/16 1752

## 2016-05-27 NOTE — ED Triage Notes (Signed)
Pt complaint of laceration to right forearm from stairwell at 1000 this morning. Pt states has had TDAP within 5 years.

## 2016-06-07 ENCOUNTER — Emergency Department (HOSPITAL_COMMUNITY)
Admission: EM | Admit: 2016-06-07 | Discharge: 2016-06-07 | Disposition: A | Discharge status: Home / Self Care | Payer: BLUE CROSS/BLUE SHIELD | Attending: Emergency Medicine | Admitting: Emergency Medicine

## 2016-06-07 ENCOUNTER — Encounter (HOSPITAL_COMMUNITY): Payer: Self-pay | Admitting: Emergency Medicine

## 2016-06-07 DIAGNOSIS — Z5189 Encounter for other specified aftercare: Secondary | ICD-10-CM

## 2016-06-07 DIAGNOSIS — Z4802 Encounter for removal of sutures: Secondary | ICD-10-CM | POA: Insufficient documentation

## 2016-06-07 DIAGNOSIS — Z79899 Other long term (current) drug therapy: Secondary | ICD-10-CM | POA: Insufficient documentation

## 2016-06-07 DIAGNOSIS — F17210 Nicotine dependence, cigarettes, uncomplicated: Secondary | ICD-10-CM | POA: Insufficient documentation

## 2016-06-07 DIAGNOSIS — Z4801 Encounter for change or removal of surgical wound dressing: Secondary | ICD-10-CM | POA: Insufficient documentation

## 2016-06-07 NOTE — ED Provider Notes (Signed)
[facility]-EMER[facility]ENC[facility] DEPT Provider Note   CSN: [id number] Arrival date & time: 06/07/16  1357  By signing my name below, I, Emmanuella Mensah, attest that this documentation has been prepared under the direction and in the presence of Wells FargoKelly Gekas, PA-C. Electronically Signed: Angelene GiovanniEmmanuella Mensah, ED Scribe. 06/07/16. 2:20 PM.   History   Chief Complaint Chief Complaint  Patient presents with  . Suture / Staple Removal    r/forearm    HPI Comments: Todd Yoder is a 30 y.o. male who presents to the Emergency Department requesting suture removal s/p laceration repair that occurred on 05/27/16. Pt was evaluated after sustaining a 10 cm laceration to his right forearm when he tripped and fell on a sharp edge of a railing while running up the stairs. His tetanus vaccine was UTD at that time. He received 3 sutures for fascia repair with vicryl and 8 sutures for skin repair with ethilon then discharged with antibiotic ointment and bulky dressing. He states that the wound is healing well although it is mildly painful and itchy. No drainage from wound. He denies any fever, chills, nausea, vomiting, generalized rash, or any other symptoms.   The history is provided by the patient. No language interpreter was used.    Past Medical History:  Diagnosis Date  . GERD (gastroesophageal reflux disease)     There are no active problems to display for this patient.   Past Surgical History:  Procedure Laterality Date  . TOOTH EXTRACTION         Home Medications    Prior to Admission medications   Medication Sig Start Date End Date Taking? Authorizing Provider  benzonatate (TESSALON) 100 MG capsule Take 1 capsule (100 mg total) by mouth every 8 (eight) hours. 09/20/15   Laurence Spatesachel Morgan Little, MD  ibuprofen (ADVIL,MOTRIN) 200 MG tablet Take 600 mg by mouth every 6 (six) hours as needed for moderate pain.    Historical Provider, MD  ondansetron (ZOFRAN ODT) 4 MG disintegrating tablet Take 1 tablet (4  mg total) by mouth every 8 (eight) hours as needed for nausea or vomiting. 09/20/15   Laurence Spatesachel Morgan Little, MD    Family History Family History  Problem Relation Age of Onset  . Diabetes Mother   . Hypertension Mother   . Diabetes Father   . Hypertension Father     Social History Social History  Substance Use Topics  . Smoking status: Current Every Day Smoker    Packs/day: 0.25    Types: Cigarettes  . Smokeless tobacco: Not on file  . Alcohol use Yes     Comment: occasional     Allergies   Amoxicillin-pot clavulanate   Review of Systems Review of Systems  Constitutional: Negative for chills and fever.  Gastrointestinal: Negative for nausea and vomiting.  Skin: Positive for wound.     Physical Exam Updated Vital Signs There were no vitals taken for this visit.  Physical Exam  Constitutional: He is oriented to person, place, and time. He appears well-developed and well-nourished.  HENT:  Head: Normocephalic and atraumatic.  Cardiovascular: Normal rate.   Pulmonary/Chest: Effort normal.  Neurological: He is alert and oriented to person, place, and time.  Skin: Skin is warm and dry.  Well healing laceration with 8 sutures intact. No signs of infection  Psychiatric: He has a normal mood and affect.  Nursing note and vitals reviewed.    ED Treatments / Results  DIAGNOSTIC STUDIES: Oxygen Saturation is 100% on RA, normal by my interpretation.  COORDINATION OF CARE: 2:18 PM- Pt advised of plan for treatment and pt agrees. Pt received suture removal here. Advised to clean area with soap and water and to use Vaseline.    Labs (all labs ordered are listed, but only abnormal results are displayed) Labs Reviewed - No data to display  EKG  EKG Interpretation None       Radiology No results found.  Procedures .Suture Removal Date/Time: 06/07/2016 2:14 PM Performed by: Terance HartGEKAS, KELLY MARIE Authorized by: Terance HartGEKAS, KELLY MARIE   Consent:    Consent  obtained:  Verbal   Consent given by:  Patient   Risks discussed:  Pain Location:    Location:  Upper extremity   Upper extremity location:  Arm   Arm location:  R lower arm Procedure details:    Wound appearance:  No signs of infection   Number of sutures removed:  8 Post-procedure details:    Post-removal:  No dressing applied   Patient tolerance of procedure:  Tolerated well, no immediate complications   (including critical care time)  Medications Ordered in ED Medications - No data to display   Initial Impression / Assessment and Plan / ED Course  Terance HartKelly Gekas, PA-C has reviewed the triage vital signs and the nursing notes.  Pertinent labs & imaging results that were available during my care of the patient were reviewed by me and considered in my medical decision making (see chart for details).  Clinical Course     Pt presents to the ED for suture removal and wound check as above. Procedure tolerated well. Vitals normal, no signs of infection. Scar minimization & return precautions given at dc.    Final Clinical Impressions(s) / ED Diagnoses   Final diagnoses:  Visit for suture removal  Visit for wound check    New Prescriptions New Prescriptions   No medications on file   I personally performed the services described in this documentation, which was scribed in my presence. The recorded information has been reviewed and is accurate.    Bethel BornKelly Marie Gekas, PA-C 06/07/16 2127    Bethann BerkshireJoseph Zammit, MD 06/08/16 [phone]

## 2016-06-07 NOTE — ED Triage Notes (Signed)
Sutures in r/forearm. Suture line intact, well approimated

## 2016-06-07 NOTE — Discharge Instructions (Signed)
Keep wound clean with soap and water Use can use vaseline or any kind of ointment to help minimize the scar

## 2017-01-10 ENCOUNTER — Emergency Department (HOSPITAL_COMMUNITY)
Admission: EM | Admit: 2017-01-10 | Discharge: 2017-01-10 | Disposition: A | Discharge status: Home / Self Care | Payer: BLUE CROSS/BLUE SHIELD | Attending: Emergency Medicine | Admitting: Emergency Medicine

## 2017-01-10 ENCOUNTER — Encounter (HOSPITAL_COMMUNITY): Payer: Self-pay | Admitting: Emergency Medicine

## 2017-01-10 DIAGNOSIS — B86 Scabies: Secondary | ICD-10-CM

## 2017-01-10 DIAGNOSIS — R21 Rash and other nonspecific skin eruption: Secondary | ICD-10-CM

## 2017-01-10 DIAGNOSIS — F17210 Nicotine dependence, cigarettes, uncomplicated: Secondary | ICD-10-CM | POA: Insufficient documentation

## 2017-01-10 MED ORDER — PERMETHRIN 5 % EX CREA: TOPICAL_CREAM | CUTANEOUS | 0 refills | Status: DC

## 2017-01-10 NOTE — ED Triage Notes (Signed)
Pt reports 5 ay hx of red raised itching rash on lower legs,  back and between fingers. 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

## 2017-01-10 NOTE — Discharge Instructions (Signed)
Use over the counter hydrocortisone cream as needed for itching. Consider using over the counter benadryl or other antihistamines as needed for itching. Use permethrin cream as directed. WASH ALL SHEETS/CLOTHING/LINENS/BEDDING IN HOT WATER; if you can't wash a linen/fabric item, then wrap it in plastic for 24 hours. Continue your usual home medications. Get plenty of rest and drink plenty of fluids. Avoid any known triggers. Please followup with your primary doctor in 1 week for recheck of symptoms. Return to the ER for changes or worsening symptoms

## 2017-01-10 NOTE — ED Provider Notes (Signed)
[facility]-EMERGENCY DEPT Provider Note   CSN: [id number] Arrival date & time: 01/10/17  1322  By signing my name below, I, Linna Darner, attest that this documentation has been prepared under the direction and in the presence of [address], [employer]. Electronically Signed: Linna Darner, Scribe. 01/10/2017. 3:53 PM.  History   Chief Complaint Chief Complaint  Patient presents with  . Rash    itching   The history is provided by the patient and medical records. No language interpreter was used.  Rash   This is a new problem. The current episode started more than 2 days ago. The problem has not changed since onset.Associated with: possible exposure to scabies. There has been no fever. The rash is present on the right arm, left lower leg, left upper leg, right lower leg, right upper leg, left arm, abdomen, torso, back, scalp, trunk, left buttock and right buttock. The pain is at a severity of 0/10. The patient is experiencing no pain. The pain has been constant since onset. Associated symptoms include itching (+burning pain). Pertinent negatives include no pain. Treatments tried: neem oil. The treatment provided moderate relief.     HPI Comments: Todd Yoder is a 31 y.o. male who presents to the Emergency Department complaining of a persistent generalized itchy body rash beginning 6-7 days ago. He reports pruritic "bumps" to most areas of his body, including his extremities, trunk, and scalp. Patient reports that his workplace is currently experiencing an outbreak of scabies and he has several coworkers with similar symptoms. He also notes that several of his coworkers have flea infestations in their homes as well. Patient notes his itching is worse with heat but he has been using "neem oil" with good improvement. No other medications or treatments tried. No new soaps, lotions, detergents, creams, medications, or foods. No new plant/animal contact. Patient denies facial swelling, sore throat,  fevers, chills, CP, SOB, abd pain, N/V/D/C, hematuria, dysuria, numbness, tingling, focal weakness, or any other complaints at this time.   Past Medical History:  Diagnosis Date  . GERD (gastroesophageal reflux disease)     There are no active problems to display for this patient.   Past Surgical History:  Procedure Laterality Date  . TOOTH EXTRACTION         Home Medications    Prior to Admission medications   Medication Sig Start Date End Date Taking? Authorizing Provider  benzonatate (TESSALON) 100 MG capsule Take 1 capsule (100 mg total) by mouth every 8 (eight) hours. 09/20/15   Little, Ambrose Finland, MD  ibuprofen (ADVIL,MOTRIN) 200 MG tablet Take 600 mg by mouth every 6 (six) hours as needed for moderate pain.    [provider]  ondansetron (ZOFRAN ODT) 4 MG disintegrating tablet Take 1 tablet (4 mg total) by mouth every 8 (eight) hours as needed for nausea or vomiting. 09/20/15   Little, Ambrose Finland, MD  permethrin (ELIMITE) 5 % cream Apply a generous amount of cream from head to feet, leave on for 8 to 14 hours, wash with soap/water 01/10/17   Taylan Marez, [place], PA-C    Family History Family History  Problem Relation Age of Onset  . Diabetes Mother   . Hypertension Mother   . Hypertension Father     Social History Social History  Substance Use Topics  . Smoking status: Current Some Day Smoker    Packs/day: 0.25    Types: Cigarettes  . Smokeless tobacco: Never Used  . Alcohol use Yes     Comment: occasional  Allergies   Amoxicillin-pot clavulanate   Review of Systems Review of Systems  Constitutional: Negative for chills and fever.  HENT: Negative for drooling, facial swelling, sore throat and trouble swallowing.   Respiratory: Negative for shortness of breath.   Cardiovascular: Negative for chest pain.  Gastrointestinal: Negative for abdominal pain, constipation, diarrhea, nausea and vomiting.  Genitourinary: Negative for dysuria and  hematuria.  Musculoskeletal: Negative for arthralgias and myalgias.  Skin: Positive for itching (+burning pain) and rash. Negative for color change.  Allergic/Immunologic: Negative for immunocompromised state.  Neurological: Negative for weakness and numbness.  Psychiatric/Behavioral: Negative for confusion.   All other systems reviewed and are negative for acute change except as noted in the HPI.  Physical Exam Updated Vital Signs BP 136/77 (BP Location: Left Arm)   Pulse (!) 55   Temp 97.8 F (36.6 C) (Oral)   Wt 189 lb (85.7 kg)   SpO2 99%   BMI 23.01 kg/m   Physical Exam  Constitutional: He is oriented to person, place, and time. Vital signs are normal. He appears well-developed and well-nourished.  Non-toxic appearance. No distress.  Afebrile, nontoxic, NAD  HENT:  Head: Normocephalic and atraumatic.  Mouth/Throat: Mucous membranes are normal.  Eyes: Conjunctivae and EOM are normal. Right eye exhibits no discharge. Left eye exhibits no discharge.  Neck: Normal range of motion. Neck supple.  Cardiovascular: Normal rate and intact distal pulses.   Pulmonary/Chest: Effort normal. No respiratory distress.  Abdominal: Normal appearance. He exhibits no distension.  Musculoskeletal: Normal range of motion.  Neurological: He is alert and oriented to person, place, and time. He has normal strength. No sensory deficit.  Skin: Skin is warm, dry and intact. Rash noted. Rash is maculopapular.  One small maculopapular lesion to the right calf, no other rashes noted over all exposed surfaces, several excoriated areas noted, no evidence of secondary infection. No interdigital webspace involvement.   Psychiatric: He has a normal mood and affect.  Nursing note and vitals reviewed.  ED Treatments / Results  Labs (all labs ordered are listed, but only abnormal results are displayed) Labs Reviewed - No data to display  EKG  EKG Interpretation None       Radiology No results  found.  Procedures Procedures (including critical care time)  DIAGNOSTIC STUDIES: Oxygen Saturation is 99% on RA, normal by my interpretation.    COORDINATION OF CARE: 3:43 PM Discussed treatment plan with pt at bedside and pt agreed to plan.  Medications Ordered in ED Medications - No data to display   Initial Impression / Assessment and Plan / ED Course  I have reviewed the triage vital signs and the nursing notes.  Pertinent labs & imaging results that were available during my care of the patient were reviewed by me and considered in my medical decision making (see chart for details).     31 y.o. male here with generalized itchy rash x7 days, exposure to scabies at work. On exam, only one small maculopapular lesion to R leg noted, no other rashes seen but several excoriated areas noted; no interdigital webspace involvement. No evidence of secondary infection. It's reasonable to treat for scabies empirically. Advised OTC remedies to help. Advised washing fabrics in hot water, or covering with plastic x24-48hrs. F/up with PCP in 1wk. I explained the diagnosis and have given explicit precautions to return to the ER including for any other new or worsening symptoms. The patient understands and accepts the medical plan as it's been dictated and I have  answered their questions. Discharge instructions concerning home care and prescriptions have been given. The patient is STABLE and is discharged to home in good condition.   I personally performed the services described in this documentation, which was scribed in my presence. The recorded information has been reviewed and is accurate.   Final Clinical Impressions(s) / ED Diagnoses   Final diagnoses:  Rash  Scabies    New Prescriptions New Prescriptions   PERMETHRIN (ELIMITE) 5 % CREAM    Apply a generous amount of cream from head to feet, leave on for 8 to 14 hours, wash with [address] [place], [place] 01/10/17 1558     Cardama, Amadeo Garnet, MD 01/11/17 [phone]

## 2017-08-24 ENCOUNTER — Emergency Department (HOSPITAL_COMMUNITY): Payer: Self-pay

## 2017-08-24 ENCOUNTER — Encounter (HOSPITAL_COMMUNITY): Payer: Self-pay | Admitting: Emergency Medicine

## 2017-08-24 ENCOUNTER — Emergency Department (HOSPITAL_COMMUNITY)
Admission: EM | Admit: 2017-08-24 | Discharge: 2017-08-24 | Disposition: A | Discharge status: Home / Self Care | Payer: Self-pay | Attending: Emergency Medicine | Admitting: Emergency Medicine

## 2017-08-24 DIAGNOSIS — Z79899 Other long term (current) drug therapy: Secondary | ICD-10-CM | POA: Insufficient documentation

## 2017-08-24 DIAGNOSIS — F17210 Nicotine dependence, cigarettes, uncomplicated: Secondary | ICD-10-CM | POA: Insufficient documentation

## 2017-08-24 DIAGNOSIS — M25561 Pain in right knee: Secondary | ICD-10-CM | POA: Insufficient documentation

## 2017-08-24 NOTE — Discharge Instructions (Signed)
Please read instructions below. Apply ice to your knee for 20 minutes at a time. Elevate it when possble. You can take aleve every 12 hours as needed for pain. Keep the brace on at all times. Schedule an appointment with the orthopedic specialist in 1 week for  further evaluation of your injury. Return to the ER for new or concerning symptoms.

## 2017-08-24 NOTE — ED Provider Notes (Signed)
[facility]-EMERGENCY DEPT Provider Note   CSN: [id number] Arrival date & time: 08/24/17  1048     History   Chief Complaint Chief Complaint  Patient presents with  . Knee Pain    HPI Todd Yoder is a 32 y.o. male presenting to the ED with right knee pain that has been worsening since Thursday.  Patient states he has having problems with both of his knees feeling sore for the past few weeks, however on Thursday he states he was sitting crosslegged and when he went to get up he noticed immediate pain to the right knee.  He has been having worsening pain and swelling since that time.  Pain is worse with weightbearing and ambulation, improved with rest and elevation.  He is also been taking Aleve with improvement in symptoms.  No particular fall or injury reported.  No previous injury to knee.  The history is provided by the patient.    Past Medical History:  Diagnosis Date  . GERD (gastroesophageal reflux disease)     There are no active problems to display for this patient.   Past Surgical History:  Procedure Laterality Date  . TOOTH EXTRACTION         Home Medications    Prior to Admission medications   Medication Sig Start Date End Date Taking? Authorizing Provider  benzonatate (TESSALON) 100 MG capsule Take 1 capsule (100 mg total) by mouth every 8 (eight) hours. 09/20/15   Little, Ambrose Finland, MD  ibuprofen (ADVIL,MOTRIN) 200 MG tablet Take 600 mg by mouth every 6 (six) hours as needed for moderate pain.    [provider]  ondansetron (ZOFRAN ODT) 4 MG disintegrating tablet Take 1 tablet (4 mg total) by mouth every 8 (eight) hours as needed for nausea or vomiting. 09/20/15   Little, Ambrose Finland, MD  permethrin (ELIMITE) 5 % cream Apply a generous amount of cream from head to feet, leave on for 8 to 14 hours, wash with soap/water 01/10/17   Street, [place], PA-C    Family History Family History  Problem Relation Age of Onset  .  Diabetes Mother   . Hypertension Mother   . Hypertension Father     Social History Social History   Tobacco Use  . Smoking status: Current Some Day Smoker    Packs/day: 0.25    Types: Cigarettes  . Smokeless tobacco: Never Used  Substance Use Topics  . Alcohol use: Yes    Comment: occasional  . Drug use: Yes    Types: Marijuana     Allergies   Amoxicillin-pot clavulanate   Review of Systems Review of Systems  Constitutional: Negative for fever.  Musculoskeletal: Positive for arthralgias and joint swelling.  Skin: Negative for color change.     Physical Exam Updated Vital Signs BP 133/90 (BP Location: Right Arm)   Pulse 85   Temp 98.2 F (36.8 C) (Oral)   Resp 18   SpO2 100%   Physical Exam  Constitutional: He appears well-developed and well-nourished.  HENT:  Head: Normocephalic and atraumatic.  Eyes: Conjunctivae are normal.  Cardiovascular: Normal rate and intact distal pulses.  Pulmonary/Chest: Effort normal.  Musculoskeletal:  Right knee with edema and effusion noted.  Diffusely tender along medial and lateral aspects as well as posteriorly.  Normal range of motion though with pain.  Negative anterior and posterior drawer.  Pain with valgus and varus, however appears stable.  No erythema or significant warmth noted to joint. Left knee without edema,  mild TTP over medial and lateral aspects.  Normal range of motion, negative valgus and varus, negative anterior posterior drawer.  Psychiatric: He has a normal mood and affect. His behavior is normal.  Nursing note and vitals reviewed.    ED Treatments / Results  Labs (all labs ordered are listed, but only abnormal results are displayed) Labs Reviewed - No data to display  EKG  EKG Interpretation None       Radiology Dg Knee Complete 4 Views Right  Result Date: 08/24/2017 CLINICAL DATA:  Diffuse right knee pain for 1 week. EXAM: RIGHT KNEE - COMPLETE 4+ VIEW COMPARISON:  None. FINDINGS: No  evidence of fracture, or dislocation. Normal osseous mineralization. Small suprapatellar joint effusion. IMPRESSION: Small suprapatellar joint effusion without radiographic evidence of fracture. Internal derangement of the knee is suspected. Electronically Signed   By: Ted Mcalpineobrinka  Dimitrova M.D.   On: 08/24/2017 14:54    Procedures Procedures (including critical care time)  Medications Ordered in ED Medications - No data to display   Initial Impression / Assessment and Plan / ED Course  I have reviewed the triage vital signs and the nursing notes.  Pertinent labs & imaging results that were available during my care of the patient were reviewed by me and considered in my medical decision making (see chart for details).     Pt with right knee pain x1 week. Diffuse tenderness on exam with evidence of effusion, suspect possible internal derangement. Patient X-Ray negative for obvious fracture or dislocation, though with suprapatellar effusion. Pt advised to follow up with orthopedics in 1 week. Patient given knee immobilizer brace while in ED, conservative therapy recommended and discussed. Patient will be dc home & is agreeable with above plan.  Discussed results, findings, treatment and follow up. Patient advised of return precautions. Patient verbalized understanding and agreed with plan.  Final Clinical Impressions(s) / ED Diagnoses   Final diagnoses:  Acute pain of right knee    ED Discharge Orders    None       Robinson, SwazilandJordan N, PA-C 08/24/17 1504    Nira Connardama, Pedro Eduardo, MD 08/26/17 [phone]

## 2017-08-24 NOTE — ED Triage Notes (Signed)
Patient c/o bilat knee pain for months. Pt reports week [place] and pain got worse. Patient had limp gait walking from lobby to triage room. Unable to sit Bangladeshindian style due to knee locking up and pain.

## 2017-09-13 ENCOUNTER — Other Ambulatory Visit: Payer: Self-pay

## 2017-09-13 ENCOUNTER — Encounter (HOSPITAL_BASED_OUTPATIENT_CLINIC_OR_DEPARTMENT_OTHER): Payer: Self-pay | Admitting: *Deleted

## 2017-09-15 NOTE — H&P (Signed)
[facility] [address]   [address] [place], [place] [id number] [phone]  A Division of [facility]  RE: Todd MinionFoggie, Todd Yoder                                  [phone]         DOB: 08-04-1985  09/11/2017  Reason for visit:  Followup MRI of the right knee.  He suffered a twisting injury on 08/17/2017 with a pop.   OBJECTIVE: The patient is a well appearing male, in no apparent distress.  He still has medial tenderness.  He has difficulty with extension.    IMAGES: MRI demonstrates a bucketed medial meniscus tear, some chondral damage on the medial tibial plateau.   ASSESSMENT/PLAN:  Bucketed medial meniscus tear.  I would like to go ahead and get him on soon to do a partial meniscectomy versus meniscal repair and chondroplasty.      Jewel Baizeimothy D.  Eulah PontMurphy, M.D.  Electronically verified by Jewel Baizeimothy D. Eulah PontMurphy, M.D. TDM:pmw D 09/13/17 T 09/14/17

## 2017-09-19 ENCOUNTER — Encounter (HOSPITAL_BASED_OUTPATIENT_CLINIC_OR_DEPARTMENT_OTHER)
Admission: RE | Disposition: A | Discharge status: Home / Self Care | Payer: Self-pay | Source: Ambulatory Visit | Attending: Orthopedic Surgery

## 2017-09-19 ENCOUNTER — Ambulatory Visit (HOSPITAL_BASED_OUTPATIENT_CLINIC_OR_DEPARTMENT_OTHER): Payer: Self-pay | Admitting: Certified Registered"

## 2017-09-19 ENCOUNTER — Ambulatory Visit (HOSPITAL_BASED_OUTPATIENT_CLINIC_OR_DEPARTMENT_OTHER)
Admission: RE | Admit: 2017-09-19 | Discharge: 2017-09-19 | Disposition: A | Discharge status: Home / Self Care | Payer: Self-pay | Source: Ambulatory Visit | Attending: Orthopedic Surgery | Admitting: Orthopedic Surgery

## 2017-09-19 ENCOUNTER — Other Ambulatory Visit: Payer: Self-pay

## 2017-09-19 ENCOUNTER — Encounter (HOSPITAL_BASED_OUTPATIENT_CLINIC_OR_DEPARTMENT_OTHER): Payer: Self-pay

## 2017-09-19 DIAGNOSIS — K219 Gastro-esophageal reflux disease without esophagitis: Secondary | ICD-10-CM | POA: Insufficient documentation

## 2017-09-19 DIAGNOSIS — F17200 Nicotine dependence, unspecified, uncomplicated: Secondary | ICD-10-CM | POA: Insufficient documentation

## 2017-09-19 DIAGNOSIS — Y929 Unspecified place or not applicable: Secondary | ICD-10-CM | POA: Insufficient documentation

## 2017-09-19 DIAGNOSIS — S83200A Bucket-handle tear of unspecified meniscus, current injury, right knee, initial encounter: Secondary | ICD-10-CM

## 2017-09-19 DIAGNOSIS — S83241A Other tear of medial meniscus, current injury, right knee, initial encounter: Secondary | ICD-10-CM | POA: Insufficient documentation

## 2017-09-19 DIAGNOSIS — F319 Bipolar disorder, unspecified: Secondary | ICD-10-CM | POA: Insufficient documentation

## 2017-09-19 DIAGNOSIS — X58XXXA Exposure to other specified factors, initial encounter: Secondary | ICD-10-CM | POA: Insufficient documentation

## 2017-09-19 DIAGNOSIS — Z79899 Other long term (current) drug therapy: Secondary | ICD-10-CM | POA: Insufficient documentation

## 2017-09-19 HISTORY — PX: KNEE ARTHROSCOPY WITH MENISCAL REPAIR: SHX5653

## 2017-09-19 HISTORY — DX: Family history of other specified conditions: Z84.89

## 2017-09-19 HISTORY — DX: Bipolar disorder, unspecified: F31.9

## 2017-09-19 SURGERY — ARTHROSCOPY, KNEE, WITH MENISCUS REPAIR
Anesthesia: General | Site: Knee | Laterality: Right

## 2017-09-19 MED ORDER — METHOCARBAMOL 500 MG PO TABS: 500.0000 mg | ORAL_TABLET | Freq: Three times a day (TID) | ORAL | 0 refills | Status: DC | PRN

## 2017-09-19 MED ORDER — BUPIVACAINE HCL (PF) 0.5 % IJ SOLN
INTRAMUSCULAR | Status: DC | PRN
  Administered 2017-09-19: 5 mL

## 2017-09-19 MED ORDER — DEXAMETHASONE SODIUM PHOSPHATE 10 MG/ML IJ SOLN
INTRAMUSCULAR | Status: DC | PRN
  Administered 2017-09-19: 10 mg via INTRAVENOUS

## 2017-09-19 MED ORDER — BUPIVACAINE HCL (PF) 0.25 % IJ SOLN
INTRAMUSCULAR | Status: AC
  Filled 2017-09-19: qty 30

## 2017-09-19 MED ORDER — ACETAMINOPHEN 500 MG PO TABS
ORAL_TABLET | ORAL | Status: AC
  Filled 2017-09-19: qty 2

## 2017-09-19 MED ORDER — BUPIVACAINE HCL (PF) 0.5 % IJ SOLN
INTRAMUSCULAR | Status: AC
  Filled 2017-09-19: qty 30

## 2017-09-19 MED ORDER — FENTANYL CITRATE (PF) 100 MCG/2ML IJ SOLN
INTRAMUSCULAR | Status: AC
  Filled 2017-09-19: qty 2

## 2017-09-19 MED ORDER — MEPERIDINE HCL 25 MG/ML IJ SOLN: 6.2500 mg | INTRAMUSCULAR | Status: DC | PRN

## 2017-09-19 MED ORDER — METHYLPREDNISOLONE ACETATE 80 MG/ML IJ SUSP
INTRAMUSCULAR | Status: AC
Start: 2017-09-19 — End: ?
  Filled 2017-09-19: qty 1

## 2017-09-19 MED ORDER — KETOROLAC TROMETHAMINE 30 MG/ML IJ SOLN
INTRAMUSCULAR | Status: DC | PRN
  Administered 2017-09-19: 30 mg via INTRAVENOUS

## 2017-09-19 MED ORDER — FENTANYL CITRATE (PF) 100 MCG/2ML IJ SOLN
50.0000 ug | INTRAMUSCULAR | Status: AC | PRN
  Administered 2017-09-19 (×4): 50 ug via INTRAVENOUS
  Administered 2017-09-19: 100 ug via INTRAVENOUS

## 2017-09-19 MED ORDER — HYDROMORPHONE HCL 1 MG/ML IJ SOLN
INTRAMUSCULAR | Status: AC
Start: 2017-09-19 — End: ?
  Filled 2017-09-19: qty 0.5

## 2017-09-19 MED ORDER — PROPOFOL 10 MG/ML IV BOLUS
INTRAVENOUS | Status: DC | PRN
  Administered 2017-09-19: 250 mg via INTRAVENOUS

## 2017-09-19 MED ORDER — ONDANSETRON HCL 4 MG/2ML IJ SOLN
INTRAMUSCULAR | Status: DC | PRN
  Administered 2017-09-19: 4 mg via INTRAVENOUS

## 2017-09-19 MED ORDER — DOCUSATE SODIUM 100 MG PO CAPS: 100.0000 mg | ORAL_CAPSULE | Freq: Two times a day (BID) | ORAL | 0 refills | Status: DC

## 2017-09-19 MED ORDER — MIDAZOLAM HCL 2 MG/2ML IJ SOLN
INTRAMUSCULAR | Status: AC
  Filled 2017-09-19: qty 2

## 2017-09-19 MED ORDER — CHLORHEXIDINE GLUCONATE 4 % EX LIQD: 60.0000 mL | Freq: Once | CUTANEOUS | Status: DC

## 2017-09-19 MED ORDER — OXYCODONE HCL 5 MG PO TABS
5.0000 mg | ORAL_TABLET | Freq: Once | ORAL | Status: AC | PRN
  Administered 2017-09-19: 5 mg via ORAL

## 2017-09-19 MED ORDER — HYDROMORPHONE HCL 1 MG/ML IJ SOLN
0.2500 mg | INTRAMUSCULAR | Status: DC | PRN
  Administered 2017-09-19 (×3): 0.5 mg via INTRAVENOUS

## 2017-09-19 MED ORDER — KETOROLAC TROMETHAMINE 30 MG/ML IJ SOLN: 30.0000 mg | Freq: Once | INTRAMUSCULAR | Status: DC | PRN

## 2017-09-19 MED ORDER — SCOPOLAMINE 1 MG/3DAYS TD PT72: 1.0000 | MEDICATED_PATCH | Freq: Once | TRANSDERMAL | Status: DC | PRN

## 2017-09-19 MED ORDER — ONDANSETRON HCL 4 MG PO TABS
4.0000 mg | ORAL_TABLET | Freq: Three times a day (TID) | ORAL | 0 refills | Status: DC | PRN
Start: 2017-09-19 — End: 2018-11-26

## 2017-09-19 MED ORDER — CEFAZOLIN SODIUM-DEXTROSE 2-4 GM/100ML-% IV SOLN
INTRAVENOUS | Status: AC
Start: 2017-09-19 — End: ?
  Filled 2017-09-19: qty 100

## 2017-09-19 MED ORDER — CEFAZOLIN SODIUM-DEXTROSE 2-4 GM/100ML-% IV SOLN
2.0000 g | INTRAVENOUS | Status: AC
  Administered 2017-09-19: 2 g via INTRAVENOUS

## 2017-09-19 MED ORDER — ACETAMINOPHEN 500 MG PO TABS
1000.0000 mg | ORAL_TABLET | Freq: Once | ORAL | Status: AC
  Administered 2017-09-19: 1000 mg via ORAL

## 2017-09-19 MED ORDER — METHYLPREDNISOLONE ACETATE 80 MG/ML IJ SUSP
INTRAMUSCULAR | Status: DC | PRN
  Administered 2017-09-19: 80 mg

## 2017-09-19 MED ORDER — PROMETHAZINE HCL 25 MG/ML IJ SOLN: 6.2500 mg | INTRAMUSCULAR | Status: DC | PRN

## 2017-09-19 MED ORDER — SODIUM CHLORIDE 0.9 % IR SOLN
Status: DC | PRN
  Administered 2017-09-19: 3000 mL

## 2017-09-19 MED ORDER — LACTATED RINGERS IV SOLN
INTRAVENOUS | Status: DC
  Administered 2017-09-19 (×2): via INTRAVENOUS

## 2017-09-19 MED ORDER — HYDROCODONE-ACETAMINOPHEN 5-325 MG PO TABS: 1.0000 | ORAL_TABLET | Freq: Four times a day (QID) | ORAL | 0 refills | Status: DC | PRN

## 2017-09-19 MED ORDER — LIDOCAINE HCL (CARDIAC) 20 MG/ML IV SOLN
INTRAVENOUS | Status: DC | PRN
  Administered 2017-09-19: 60 mg via INTRAVENOUS

## 2017-09-19 MED ORDER — LACTATED RINGERS IV SOLN: INTRAVENOUS | Status: DC

## 2017-09-19 MED ORDER — HYDROMORPHONE HCL 1 MG/ML IJ SOLN
INTRAMUSCULAR | Status: AC
  Filled 2017-09-19: qty 0.5

## 2017-09-19 MED ORDER — OXYCODONE HCL 5 MG/5ML PO SOLN: 5.0000 mg | Freq: Once | ORAL | Status: AC | PRN

## 2017-09-19 MED ORDER — OXYCODONE HCL 5 MG PO TABS
ORAL_TABLET | ORAL | Status: AC
Start: 2017-09-19 — End: ?
  Filled 2017-09-19: qty 1

## 2017-09-19 MED ORDER — ASPIRIN EC 81 MG PO TBEC: 81.0000 mg | DELAYED_RELEASE_TABLET | Freq: Every day | ORAL | 0 refills | Status: AC

## 2017-09-19 MED ORDER — MIDAZOLAM HCL 2 MG/2ML IJ SOLN
1.0000 mg | INTRAMUSCULAR | Status: DC | PRN
  Administered 2017-09-19: 2 mg via INTRAVENOUS

## 2017-09-19 SURGICAL SUPPLY — 41 items
BANDAGE ACE 6X5 VEL STRL LF (GAUZE/BANDAGES/DRESSINGS) ×3 IMPLANT
BLADE CUDA 5.5 (BLADE) IMPLANT
BLADE CUDA GRT WHITE 3.5 (BLADE) IMPLANT
BLADE CUTTER GATOR 3.5 (BLADE) ×3 IMPLANT
BLADE CUTTER MENIS 5.5 (BLADE) IMPLANT
CHLORAPREP W/TINT 26ML (MISCELLANEOUS) ×3 IMPLANT
CUFF TOURNIQUET SINGLE 34IN LL (TOURNIQUET CUFF) ×3 IMPLANT
CUTTER MENISCUS  4.2MM (BLADE)
CUTTER MENISCUS 4.2MM (BLADE) IMPLANT
CUTTER SUTURE MENISCAL (MISCELLANEOUS) ×3 IMPLANT
DRAPE ARTHROSCOPY W/POUCH 90 (DRAPES) ×3 IMPLANT
DRAPE IMP U-DRAPE 54X76 (DRAPES) ×3 IMPLANT
DRAPE U-SHAPE 47X51 STRL (DRAPES) ×3 IMPLANT
DRSG EMULSION OIL 3X3 NADH (GAUZE/BANDAGES/DRESSINGS) ×3 IMPLANT
GAUZE SPONGE 4X4 12PLY STRL (GAUZE/BANDAGES/DRESSINGS) ×6 IMPLANT
GLOVE BIO SURGEON STRL SZ 6.5 (GLOVE) ×2 IMPLANT
GLOVE BIO SURGEON STRL SZ7.5 (GLOVE) ×6 IMPLANT
GLOVE BIO SURGEONS STRL SZ 6.5 (GLOVE) ×1
GLOVE BIOGEL PI IND STRL 7.0 (GLOVE) ×1 IMPLANT
GLOVE BIOGEL PI IND STRL 8 (GLOVE) ×2 IMPLANT
GLOVE BIOGEL PI IND STRL 8.5 (GLOVE) IMPLANT
GLOVE BIOGEL PI INDICATOR 7.0 (GLOVE) ×2
GLOVE BIOGEL PI INDICATOR 8 (GLOVE) ×4
GLOVE BIOGEL PI INDICATOR 8.5 (GLOVE)
GOWN STRL REUS W/ TWL LRG LVL3 (GOWN DISPOSABLE) ×2 IMPLANT
GOWN STRL REUS W/ TWL XL LVL3 (GOWN DISPOSABLE) IMPLANT
GOWN STRL REUS W/TWL LRG LVL3 (GOWN DISPOSABLE) ×4
GOWN STRL REUS W/TWL XL LVL3 (GOWN DISPOSABLE)
IMPL MENISCAL REP CVD NDL (Anchor) ×3 IMPLANT
IMPLANT MENISCAL REP CVD NDL (Anchor) ×9 IMPLANT
KNEE WRAP E Z 3 GEL PACK (MISCELLANEOUS) ×3 IMPLANT
MANIFOLD NEPTUNE II (INSTRUMENTS) ×3 IMPLANT
PACK ARTHROSCOPY DSU (CUSTOM PROCEDURE TRAY) ×3 IMPLANT
PACK BASIN DAY SURGERY FS (CUSTOM PROCEDURE TRAY) ×3 IMPLANT
PROBE BIPOLAR ATHRO 135MM 90D (MISCELLANEOUS) IMPLANT
SUT ETHILON 3 0 PS 1 (SUTURE) ×3 IMPLANT
TAPE CLOTH 3X10 TAN LF (GAUZE/BANDAGES/DRESSINGS) ×3 IMPLANT
TOWEL OR 17X24 6PK STRL BLUE (TOWEL DISPOSABLE) ×3 IMPLANT
TOWEL OR NON WOVEN STRL DISP B (DISPOSABLE) IMPLANT
TUBING ARTHRO INFLOW-ONLY STRL (TUBING) ×3 IMPLANT
WATER STERILE IRR 1000ML POUR (IV SOLUTION) ×3 IMPLANT

## 2017-09-19 NOTE — Anesthesia Procedure Notes (Signed)
Procedure Name: LMA Insertion Date/Time: 09/19/2017 12:38 PM Performed by: Sheryn BisonBlocker, Webber Michiels D, CRNA Pre-anesthesia Checklist: Patient identified, Emergency Drugs available, Suction available and Patient being monitored Patient Re-evaluated:Patient Re-evaluated prior to induction Oxygen Delivery Method: Circle system utilized Preoxygenation: Pre-oxygenation with 100% oxygen Induction Type: IV induction Ventilation: Mask ventilation without difficulty LMA: LMA inserted LMA Size: 5.0 Number of attempts: 1 Airway Equipment and Method: Bite block Placement Confirmation: positive ETCO2 Tube secured with: Tape Dental Injury: Teeth and Oropharynx as per pre-operative assessment

## 2017-09-19 NOTE — Interval H&P Note (Signed)
History and Physical Interval Note:  09/19/2017 12:17 PM  Todd Yoder  has presented today for surgery, with the diagnosis of right knee medial meniscal tear  The various methods of treatment have been discussed with the patient and family. After consideration of risks, benefits and other options for treatment, the patient has consented to  Procedure(s): RIGHT KNEE ARTHROSCOPY WITH MEDIAL MENISECTOMY VS REPAIR (Right) as a surgical intervention .  The patient's history has been reviewed, patient examined, no change in status, stable for surgery.  I have reviewed the patient's chart and labs.  Questions were answered to the patient's satisfaction.     Taraoluwa Thakur D

## 2017-09-19 NOTE — Anesthesia Preprocedure Evaluation (Addendum)
Anesthesia Evaluation  Patient identified by MRN, date of birth, ID band Patient awake    Reviewed: Allergy & Precautions, NPO status , Patient's Chart, lab work & pertinent test results  Airway Mallampati: II  TM Distance: >3 FB Neck ROM: Full    Dental  (+) Chipped, Missing,    Pulmonary neg pulmonary ROS, Current Smoker,    Pulmonary exam normal breath sounds clear to auscultation       Cardiovascular negative cardio ROS Normal cardiovascular exam Rhythm:Regular Rate:Normal     Neuro/Psych PSYCHIATRIC DISORDERS Bipolar Disorder negative neurological ROS     GI/Hepatic Neg liver ROS, GERD  ,  Endo/Other  negative endocrine ROS  Renal/GU negative Renal ROS     Musculoskeletal negative musculoskeletal ROS (+)   Abdominal   Peds  Hematology negative hematology ROS (+)   Anesthesia Other Findings   Reproductive/Obstetrics                            Anesthesia Physical Anesthesia Plan  ASA: II  Anesthesia Plan: General   Post-op Pain Management:    Induction: Intravenous  PONV Risk Score and Plan: 2  Airway Management Planned: LMA  Additional Equipment:   Intra-op Plan:   Post-operative Plan: Extubation in OR  Informed Consent: I have reviewed the patients History and Physical, chart, labs and discussed the procedure including the risks, benefits and alternatives for the proposed anesthesia with the patient or authorized representative who has indicated his/her understanding and acceptance.   Dental advisory given  Plan Discussed with: CRNA  Anesthesia Plan Comments:         Anesthesia Quick Evaluation

## 2017-09-19 NOTE — Transfer of Care (Signed)
Immediate Anesthesia Transfer of Care Note  Patient: Toribio HarbourKenneth M Burget  Procedure(s) Performed: KNEE ARTHROSCOPY WITH MENISCAL REPAIR (Right Knee)  Patient Location: PACU  Anesthesia Type:General  Level of Consciousness: awake and patient cooperative  Airway & Oxygen Therapy: Patient Spontanous Breathing and Patient connected to face mask oxygen  Post-op Assessment: Report given to RN and Post -op Vital signs reviewed and stable  Post vital signs: Reviewed and stable  Last Vitals:  Vitals:   09/19/17 1333 09/19/17 1334  BP: 109/70   Pulse: 73 71  Resp:  13  Temp:    SpO2: 100% 100%    Last Pain:  Vitals:   09/19/17 0859  TempSrc: Oral  PainSc: 3       Patients Stated Pain Goal: 2 (09/19/17 0859)  Complications: No apparent anesthesia complications

## 2017-09-19 NOTE — Anesthesia Postprocedure Evaluation (Signed)
Anesthesia Post Note  Patient: Todd Yoder  Procedure(s) Performed: KNEE ARTHROSCOPY WITH MENISCAL REPAIR (Right Knee)     Patient location during evaluation: PACU Anesthesia Type: General Level of consciousness: awake and alert Pain management: pain level controlled Vital Signs Assessment: post-procedure vital signs reviewed and stable Respiratory status: spontaneous breathing, nonlabored ventilation, respiratory function stable and patient connected to nasal cannula oxygen Cardiovascular status: blood pressure returned to baseline and stable Postop Assessment: no apparent nausea or vomiting Anesthetic complications: no    Last Vitals:  Vitals:   09/19/17 1415 09/19/17 1430  BP: (!) 147/90 119/77  Pulse: 72 78  Resp: (!) 7 11  Temp:    SpO2: 100% 96%    Last Pain:  Vitals:   09/19/17 1430  TempSrc:   PainSc: 3                  Maicol Bowland EDWARD

## 2017-09-19 NOTE — Interval H&P Note (Signed)
History and Physical Interval Note:  09/19/2017 9:24 AM  Todd Yoder  has presented today for surgery, with the diagnosis of right knee medial meniscal tear  The various methods of treatment have been discussed with the patient and family. After consideration of risks, benefits and other options for treatment, the patient has consented to  Procedure(s): RIGHT KNEE ARTHROSCOPY WITH MEDIAL MENISECTOMY VS REPAIR (Right) as a surgical intervention .  The patient's history has been reviewed, patient examined, no change in status, stable for surgery.  I have reviewed the patient's chart and labs.  Questions were answered to the patient's satisfaction.     Lanya Bucks D

## 2017-09-19 NOTE — Op Note (Signed)
09/19/2017  1:56 PM  PATIENT:  Todd Yoder    PRE-OPERATIVE DIAGNOSIS:  right knee medial meniscal tear  POST-OPERATIVE DIAGNOSIS:  Same  PROCEDURE:  KNEE ARTHROSCOPY WITH MENISCAL REPAIR  SURGEON:  Guilherme Schwenke, Jewel BaizeIMOTHY D, MD  ASSISTANT: Aquilla HackerHenry Martensen, PA-C, he was present and scrubbed throughout the case, critical for completion in a timely fashion, and for retraction, instrumentation, and closure.   ANESTHESIA:   General  BLOOD LOSS: min  COMPLICATIONS: None   PREOPERATIVE INDICATIONS:  Todd HarbourKenneth M Figge is a  32 y.o. male with a diagnosis of right knee medial meniscal tear who failed conservative measures and elected for surgical management.    The risks benefits and alternatives were discussed with the patient preoperatively including but not limited to the risks of infection, bleeding, nerve injury, cardiopulmonary complications, the need for revision surgery, among others, and the patient was willing to proceed.  OPERATIVE IMPLANTS: linvatec suture repair  OPERATIVE FINDINGS: Examination under anesthesia: stable Diagnostic Arthroscopy:  articular cartilage:some femoral wear medially Medial meniscus:bucket tear Lateral meniscus:stable Anterior cruciate ligament/PCL: stble Loose bodies: none    OPERATIVE PROCEDURE:  Patient was identified in the preoperative holding area and site was marked by me male was transported to the operating theater and placed on the table in supine position taking care to pad all bony prominences. After a preincinduction time out anesthesia was induced.  male received ancef for preoperative antibiotics. The right lower extremity was prepped and draped in normal sterile fashion and a pre-incision timeout was performed.   A small stab incision was made in the anterolateral portal position. The arthroscope was introduced in the joint. A medial portal was then established under direct visualization just above the anterior horn of the medial  meniscus. Diagnostic arthroscopy was then carried out with findings as described above.  Examined his knee and noted the above findings.  He had bucketed his medial meniscus this was a very large tear reduced well it was in the just barely in the red white zone.  Given his young age the stable reduction I elected to repair this.  I opened a Linvatec 3 implant placed 3 all inside stitches posteriorly 3 anteriorly and 2 in the central portion.  I tested the repaired it was stable through range of motion and to a probe.  I performed a chondroplasty of his femoral condyle using a shaver.  The arthroscopic equipment was removed from the joint and the portals were closed with 3-0 nylon in an interrupted fashion. The knee was infiltrated with depomedrol.  Sterile dressings were then applied including Xeroform 4 x 4's ABDs an ACE bandage.  The patient was then allowed to awaken from general anesthesia, transferred to the stretcher and taken to the recovery room in stable condition.  POSTOPERATIVE PLAN: The patient will be discharged home today and will followup in one week for suture removal and wound check.  VTE prophylaxis: mobilization WBAT knee locked in extension full time

## 2017-09-19 NOTE — Discharge Instructions (Signed)
°  Post Anesthesia Home Care Instructions  Activity: Get plenty of rest for the remainder of the day. A responsible individual must stay with you for 24 hours following the procedure.  For the next 24 hours, DO NOT: -Drive a car -Advertising copywriterperate machinery -Drink alcoholic beverages -Take any medication unless instructed by your physician -Make any legal decisions or sign important papers.  Meals: Start with liquid foods such as gelatin or soup. Progress to regular foods as tolerated. Avoid greasy, spicy, heavy foods. If nausea and/or vomiting occur, drink only clear liquids until the nausea and/or vomiting subsides. Call your physician if vomiting continues.  Special Instructions/Symptoms: Your throat may feel dry or sore from the anesthesia or the breathing tube placed in your throat during surgery. If this causes discomfort, gargle with warm salt water. The discomfort should disappear within 24 hours.  If you had a scopolamine patch placed behind your ear for the management of post- operative nausea and/or vomiting:  1. The medication in the patch is effective for 72 hours, after which it should be removed.  Wrap patch in a tissue and discard in the trash. Wash hands thoroughly with soap and water. 2. You may remove the patch earlier than 72 hours if you experience unpleasant side effects which may include dry mouth, dizziness or visual disturbances. 3. Avoid touching the patch. Wash your hands with soap and water after contact with the patch.      Keep leg elevated with ice to reduce pain and swelling. Maintain knee immobilizer (keep leg straight) at all times until your follow up.   Diet: As you were doing prior to hospitalization   Dressing:  You may remove dressings and shower over incisions 3 days after surgery.  Place clean Band-Aid over incisions.   Activity:  Increase activity slowly as tolerated, but follow the weight bearing instructions below.  The rules on driving is that you  can not be taking narcotics while you drive, and you must feel in control of the vehicle.    Weight Bearing: As tolerated.  Maintain knee immobilizer (leg straight) at all times until your follow up.  To prevent constipation: you may use a stool softener such as -  Colace (over the counter) 100 mg by mouth twice a day  Drink plenty of fluids (prune juice may be helpful) and high fiber foods Miralax (over the counter) for constipation as needed.    Itching:  If you experience itching with your medications, try taking only a single pain pill, or even half a pain pill at a time.  You can also use benadryl over the counter for itching or also to help with sleep.   Precautions:  If you experience chest pain or shortness of breath - call 911 immediately for transfer to the hospital emergency department!!  If you develop a fever greater that 101 F, purulent drainage from wound, increased redness or drainage from wound, or calf pain -- Call the office at [phone]                                                 Follow- Up Appointment:  Please call for an appointment to be seen in 2 weeks [place] - [phone]

## 2017-09-21 ENCOUNTER — Encounter (HOSPITAL_BASED_OUTPATIENT_CLINIC_OR_DEPARTMENT_OTHER): Payer: Self-pay | Admitting: Orthopedic Surgery

## 2018-07-16 ENCOUNTER — Ambulatory Visit: Payer: Self-pay | Admitting: Nurse Practitioner

## 2018-09-05 ENCOUNTER — Emergency Department (HOSPITAL_COMMUNITY): Payer: Self-pay

## 2018-09-05 ENCOUNTER — Emergency Department (HOSPITAL_COMMUNITY)
Admission: EM | Admit: 2018-09-05 | Discharge: 2018-09-05 | Disposition: A | Discharge status: Home / Self Care | Payer: Self-pay | Attending: Emergency Medicine | Admitting: Emergency Medicine

## 2018-09-05 ENCOUNTER — Encounter (HOSPITAL_COMMUNITY): Payer: Self-pay | Admitting: *Deleted

## 2018-09-05 DIAGNOSIS — Z79899 Other long term (current) drug therapy: Secondary | ICD-10-CM | POA: Insufficient documentation

## 2018-09-05 DIAGNOSIS — R059 Cough, unspecified: Secondary | ICD-10-CM

## 2018-09-05 DIAGNOSIS — J209 Acute bronchitis, unspecified: Secondary | ICD-10-CM | POA: Insufficient documentation

## 2018-09-05 DIAGNOSIS — F17210 Nicotine dependence, cigarettes, uncomplicated: Secondary | ICD-10-CM | POA: Insufficient documentation

## 2018-09-05 DIAGNOSIS — R05 Cough: Secondary | ICD-10-CM

## 2018-09-05 MED ORDER — DOXYCYCLINE HYCLATE 100 MG PO CAPS: 100.0000 mg | ORAL_CAPSULE | Freq: Two times a day (BID) | ORAL | 0 refills | Status: DC

## 2018-09-05 NOTE — ED Notes (Signed)
Discharge instructions and prescription discussed with Pt. Pt verbalized understanding. Pt stable and ambulatory.    

## 2018-09-05 NOTE — ED Provider Notes (Signed)
[facility] EMERGENCY DEPARTMENT Provider Note   CSN: [id number] Arrival date & time: 09/05/18  1525    History   Chief Complaint Chief Complaint  Patient presents with  . Cough    HPI Todd Yoder is a 33 y.o. male.     Patient presents c/o fevers, prod cough, congestion/rhinorrhea, scratchy throat, body aches for past several days. Symptoms acute onset, episodic, moderate, persistent.  Notes symptoms began 5-6 days ago, but now cough increased/productive. No definite known ill contacts. No sob. No chest pain. Denies rash. No vomiting. No abd pain. +smoker. No hx asthma or lung disease.   The history is provided by the patient.  Cough  Associated symptoms: fever and myalgias   Associated symptoms: no chest pain, no eye discharge, no headaches, no rash and no shortness of breath     Past Medical History:  Diagnosis Date  . Bipolar disorder (HCC)   . Family history of adverse reaction to anesthesia    problem with aunt waking up after surgery  . GERD (gastroesophageal reflux disease)     There are no active problems to display for this patient.   Past Surgical History:  Procedure Laterality Date  . KNEE ARTHROSCOPY WITH MENISCAL REPAIR Right 09/19/2017   Procedure: KNEE ARTHROSCOPY WITH MENISCAL REPAIR;  Surgeon: Sheral Apley, MD;  Location: [facility];  Service: Orthopedics;  Laterality: Right;  . TOOTH EXTRACTION          Home Medications    Prior to Admission medications   Medication Sig Start Date End Date Taking? Authorizing Provider  docusate sodium (COLACE) 100 MG capsule Take 1 capsule (100 mg total) by mouth 2 (two) times daily. To prevent constipation while taking pain medication. 09/19/17   Martensen, Lucretia Kern III, PA-C  HYDROcodone-acetaminophen (NORCO) 5-325 MG tablet Take 1-2 tablets by mouth every 6 (six) hours as needed for moderate pain. 09/19/17   Martensen, Lucretia Kern III, PA-C  methocarbamol  (ROBAXIN) 500 MG tablet Take 1 tablet (500 mg total) by mouth every 8 (eight) hours as needed for muscle spasms. 09/19/17   Albina Billet III, PA-C  ondansetron (ZOFRAN) 4 MG tablet Take 1 tablet (4 mg total) by mouth every 8 (eight) hours as needed for nausea or vomiting. 09/19/17   Albina Billet III, PA-C    Family History Family History  Problem Relation Age of Onset  . Diabetes Mother   . Hypertension Mother   . Hypertension Father     Social History Social History   Tobacco Use  . Smoking status: Current Some Day Smoker    Packs/day: 0.25    Types: Cigarettes  . Smokeless tobacco: Never Used  Substance Use Topics  . Alcohol use: Yes    Comment: occasional  . Drug use: Yes    Types: Marijuana     Allergies   Augmentin [amoxicillin-pot clavulanate]; Nickel; and Silver   Review of Systems Review of Systems  Constitutional: Positive for fever.  HENT: Positive for congestion. Negative for trouble swallowing.   Eyes: Negative for discharge and redness.  Respiratory: Positive for cough. Negative for shortness of breath.   Cardiovascular: Negative for chest pain.  Gastrointestinal: Negative for abdominal pain, diarrhea and vomiting.  Genitourinary: Negative for dysuria and flank pain.  Musculoskeletal: Positive for myalgias. Negative for back pain and neck pain.  Skin: Negative for rash.  Neurological: Negative for headaches.  Hematological: Does not bruise/bleed easily.  Psychiatric/Behavioral: [facility] for confusion.  Physical Exam Updated Vital Signs BP (!) 145/91 (BP Location: Right Arm)   Pulse 87   Temp (!) 100.9 F (38.3 C) (Oral)   Resp 17   Ht 1.956 m (6\' 5" )   Wt 97.5 kg   SpO2 97%   BMI 25.50 kg/m   Physical Exam Vitals signs and nursing note reviewed.  Constitutional:      Appearance: Normal appearance. He is well-developed.  HENT:     Head: Atraumatic.     Right Ear: Tympanic membrane normal.     Left Ear: Tympanic  membrane normal.     Nose: Congestion present.     Mouth/Throat:     Mouth: Mucous membranes are moist.     Pharynx: Oropharynx is clear. No oropharyngeal exudate or posterior oropharyngeal erythema.  Eyes:     General: No scleral icterus.    Conjunctiva/sclera: Conjunctivae normal.     Pupils: Pupils are equal, round, and reactive to light.  Neck:     Musculoskeletal: Normal range of motion and neck supple. No neck rigidity.     Trachea: No tracheal deviation.  Cardiovascular:     Rate and Rhythm: Normal rate and regular rhythm.     Pulses: Normal pulses.     Heart sounds: Normal heart sounds. No murmur. No friction rub. No gallop.   Pulmonary:     Effort: Pulmonary effort is normal. No accessory muscle usage or respiratory distress.     Breath sounds: Normal breath sounds.     Comments: Rhonchi left base Abdominal:     General: Bowel sounds are normal. There is no distension.     Palpations: Abdomen is soft.     Tenderness: There is no abdominal tenderness. There is no guarding.  Genitourinary:    Comments: No cva tenderness. Musculoskeletal:        General: No swelling.  Lymphadenopathy:     Cervical: No cervical adenopathy.  Skin:    General: Skin is warm and dry.     Findings: No rash.  Neurological:     Mental Status: He is alert.     Comments: Alert, speech clear. Steady gait.   Psychiatric:        Mood and Affect: Mood normal.      ED Treatments / Results  Labs (all labs ordered are listed, but only abnormal results are displayed) Labs Reviewed - No data to display  EKG None  Radiology Dg Chest 2 View  Result Date: 09/05/2018 CLINICAL DATA:  Shortness breath with cough and fever EXAM: CHEST - 2 VIEW COMPARISON:  None. FINDINGS: There is no appreciable edema or consolidation. The heart size and pulmonary vascularity are normal. No adenopathy. No pneumothorax. No bone lesions. IMPRESSION: No edema or consolidation. Electronically Signed   By: Bretta Bang III M.D.   On: 09/05/2018 16:30    Procedures Procedures (including critical care time)  Medications Ordered in ED Medications - No data to display   Initial Impression / Assessment and Plan / ED Course  I have reviewed the triage vital signs and the nursing notes.  Pertinent labs & imaging results that were available during my care of the patient were reviewed by me and considered in my medical decision making (see chart for details).  Xrays.   Reviewed nursing notes and prior charts for additional history.   xrays reviewed - no pna on cxr.   Pt tolerating po. Appears well hydrated. ?viral syndrome vs bronchitis/early pna.   abx rx.  Pt  requests work note - provided.     Final Clinical Impressions(s) / ED Diagnoses   Final diagnoses:  None    ED Discharge Orders    None       Cathren Laine, MD 09/05/18 [phone]

## 2018-09-05 NOTE — Discharge Instructions (Addendum)
It was our pleasure to provide your ER care today - we hope that you feel better.  Take antibiotic as prescribed.  Take mucinex or robitussin as need for cough.  Avoid smoking.  Follow up with primary care doctor in 1-2 weeks if symptoms fail to improve/resolve.  Return to ER if worse, new symptoms, increased trouble breathing, other concern.

## 2018-09-05 NOTE — ED Triage Notes (Signed)
Pt in c/o cough and congestion with intermittent fever for the last few days, no distress noted

## 2018-10-04 ENCOUNTER — Encounter (HOSPITAL_COMMUNITY): Payer: Self-pay | Admitting: *Deleted

## 2018-10-04 ENCOUNTER — Emergency Department (HOSPITAL_COMMUNITY)
Admission: EM | Admit: 2018-10-04 | Discharge: 2018-10-04 | Disposition: A | Discharge status: Home / Self Care | Payer: Self-pay | Attending: Emergency Medicine | Admitting: Emergency Medicine

## 2018-10-04 ENCOUNTER — Emergency Department (HOSPITAL_COMMUNITY): Payer: Self-pay

## 2018-10-04 DIAGNOSIS — R569 Unspecified convulsions: Secondary | ICD-10-CM | POA: Insufficient documentation

## 2018-10-04 DIAGNOSIS — Z79899 Other long term (current) drug therapy: Secondary | ICD-10-CM | POA: Insufficient documentation

## 2018-10-04 DIAGNOSIS — F17210 Nicotine dependence, cigarettes, uncomplicated: Secondary | ICD-10-CM | POA: Insufficient documentation

## 2018-10-04 LAB — RAPID URINE DRUG SCREEN, HOSP PERFORMED
AMPHETAMINES: NOT DETECTED
BARBITURATES: NOT DETECTED
BENZODIAZEPINES: NOT DETECTED
Cocaine: NOT DETECTED
Opiates: NOT DETECTED
Tetrahydrocannabinol: POSITIVE — AB

## 2018-10-04 LAB — BASIC METABOLIC PANEL
Anion gap: 10 (ref 5–15)
BUN: 17 mg/dL (ref 6–20)
CALCIUM: 9.1 mg/dL (ref 8.9–10.3)
CHLORIDE: 106 mmol/L (ref 98–111)
CO2: 20 mmol/L — ABNORMAL LOW (ref 22–32)
CREATININE: 1.05 mg/dL (ref 0.61–1.24)
GFR calc non Af Amer: >60 mL/min (ref >60)
GLUCOSE: 103 mg/dL — AB (ref 70–99)
Potassium: 4.4 mmol/L (ref 3.5–5.1)
Sodium: 136 mmol/L (ref 135–145)

## 2018-10-04 LAB — CBC
HEMATOCRIT: 47.2 % (ref 39.0–52.0)
Hemoglobin: 14.8 g/dL (ref 13.0–17.0)
MCH: 28.2 pg (ref 26.0–34.0)
MCHC: 31.4 g/dL (ref 30.0–36.0)
MCV: 90.1 fL (ref 80.0–100.0)
NRBC: 0 % (ref 0.0–0.2)
Platelets: 172 10*3/uL (ref 150–400)
RBC: 5.24 MIL/uL (ref 4.22–5.81)
RDW: 13.3 % (ref 11.5–15.5)
WBC: 8 10*3/uL (ref 4.0–10.5)

## 2018-10-04 LAB — CBG MONITORING, ED: Glucose-Capillary: 97 mg/dL (ref 70–99)

## 2018-10-04 MED ORDER — ACETAMINOPHEN 325 MG PO TABS
650.0000 mg | ORAL_TABLET | Freq: Once | ORAL | Status: AC
  Administered 2018-10-04: 650 mg via ORAL
  Filled 2018-10-04: qty 2

## 2018-10-04 NOTE — ED Notes (Signed)
[phone] Titus Mould, mother, call if needed

## 2018-10-04 NOTE — ED Notes (Signed)
Pt reports he had an opportunity to have his questions answered and verbalizes understanding of discharge instructions

## 2018-10-04 NOTE — Discharge Instructions (Addendum)
Please read and follow all provided instructions.  Your diagnoses today include:  1. Seizure [facility])     Tests performed today include:  CT scan of your head that did not show any serious injury.  Blood work - no problems  Vital signs. See below for your results today.   Medications prescribed:   None  Take any prescribed medications only as directed.  Home care instructions:  Follow any educational materials contained in this packet.  You may not drive until you are cleared to do so by neurology.  It is against the law to drive if you have had a suspected seizure.   Follow-up instructions: Please follow-up with the neurologist (seizure doctor) for further evaluation and advice.  Return instructions:  SEEK IMMEDIATE MEDICAL ATTENTION IF:  You have more than one episode of vomiting.   You notice dizziness or unsteadiness which is getting worse, or inability to walk.   You have another seizure.  You experience severe, persistent headaches not relieved by Tylenol.  You cannot use arms or legs normally.   You have change in speech, vision, swallowing, or understanding.   Localized weakness, numbness, tingling, or change in bowel or bladder control.  You have any other emergent concerns.  Your vital signs today were: BP 132/69    Pulse 61    Temp (!) 97.4 F (36.3 C) (Oral)    Resp 10    Ht 6\' 5"  (1.956 m)    Wt 97.5 kg    SpO2 97%    BMI 25.50 kg/m  If your blood pressure (BP) was elevated above 135/85 this visit, please have this repeated by your doctor within one month. --------------

## 2018-10-04 NOTE — ED Provider Notes (Signed)
[facility] EMERGENCY DEPARTMENT Provider Note   CSN: [id number] Arrival date & time: 10/04/18  0808    History   Chief Complaint Chief Complaint  Patient presents with  . Seizures    HPI Todd Yoder is a 33 y.o. male.     Patient with no past seizure history presents the emergency department today after having a witnessed seizure lasting 2 to 3 minutes.  Per EMS, family witnessed the event.  Patient remembers going to sleep on the couch and then waking up on the floor with EMS present.  He is unsure if he hit his head.  Patient was postictal with EMS and is now back to baseline at time of exam.  He complains of a mild headache, no neck pain.  He states that he has not been sleeping a lot recently because he works third shift and during the day helps take care of his mother who is sick.  He drinks alcohol occasionally, not daily.  He denies use of benzodiazepines or pain medication such as tramadol.  Denies any tongue injury, urinary or bowel incontinence.  No recent nausea, vomiting, or diarrhea.  States that he had a course of bronchitis about a month ago which resolved.  No seizures as a child.      Past Medical History:  Diagnosis Date  . Bipolar disorder (HCC)   . Family history of adverse reaction to anesthesia    problem with aunt waking up after surgery  . GERD (gastroesophageal reflux disease)     There are no active problems to display for this patient.   Past Surgical History:  Procedure Laterality Date  . KNEE ARTHROSCOPY WITH MENISCAL REPAIR Right 09/19/2017   Procedure: KNEE ARTHROSCOPY WITH MENISCAL REPAIR;  Surgeon: Sheral Apley, MD;  [facility];  Service: Orthopedics;  Laterality: Right;  . TOOTH EXTRACTION          Home Medications    Prior to Admission medications   Medication Sig Start Date End Date Taking? Authorizing Provider  docusate sodium (COLACE) 100 MG capsule Take 1 capsule (100 mg  total) by mouth 2 (two) times daily. To prevent constipation while taking pain medication. 09/19/17   Albina Billet III, PA-C  doxycycline (VIBRAMYCIN) 100 MG capsule Take 1 capsule (100 mg total) by mouth 2 (two) times daily. 09/05/18   Cathren Laine, MD  HYDROcodone-acetaminophen (NORCO) 5-325 MG tablet Take 1-2 tablets by mouth every 6 (six) hours as needed for moderate pain. 09/19/17   Martensen, Lucretia Kern III, PA-C  methocarbamol (ROBAXIN) 500 MG tablet Take 1 tablet (500 mg total) by mouth every 8 (eight) hours as needed for muscle spasms. 09/19/17   Albina Billet III, PA-C  ondansetron (ZOFRAN) 4 MG tablet Take 1 tablet (4 mg total) by mouth every 8 (eight) hours as needed for nausea or vomiting. 09/19/17   Albina Billet III, PA-C    Family History Family History  Problem Relation Age of Onset  . Diabetes Mother   . Hypertension Mother   . Hypertension Father     Social History Social History   Tobacco Use  . Smoking status: Current Some Day Smoker    Packs/day: 0.25    Types: Cigarettes  . Smokeless tobacco: Never Used  Substance Use Topics  . Alcohol use: Yes    Comment: occasional  . Drug use: Yes    Types: Marijuana     Allergies   Augmentin [amoxicillin-pot clavulanate];  Nickel; and Silver   Review of Systems Review of Systems  Constitutional: Negative for fever.  HENT: Negative for congestion, dental problem, rhinorrhea and sinus pressure.   Eyes: Negative for photophobia, discharge, redness and visual disturbance.  Respiratory: Negative for shortness of breath.   Cardiovascular: Negative for chest pain.  Gastrointestinal: Negative for nausea and vomiting.  Musculoskeletal: Negative for gait problem, neck pain and neck stiffness.  Skin: Negative for rash.  Neurological: Positive for seizures and headaches. Negative for syncope, speech difficulty, weakness, light-headedness and numbness.  Psychiatric/Behavioral: Negative for  confusion.     Physical Exam Updated Vital Signs BP 130/88 (BP Location: Right Arm)   Pulse 66   Temp (!) 97.4 F (36.3 C) (Oral)   Resp 20   Ht 6\' 5"  (1.956 m)   Wt 97.5 kg   SpO2 99%   BMI 25.50 kg/m   Physical Exam Vitals signs and nursing note reviewed.  Constitutional:      Appearance: He is well-developed.  HENT:     Head: Normocephalic and atraumatic.     Right Ear: Tympanic membrane, ear canal and external ear normal.     Left Ear: Tympanic membrane, ear canal and external ear normal.     Nose: Nose normal.     Mouth/Throat:     Mouth: Mucous membranes are moist.     Pharynx: Oropharynx is clear. Uvula midline.     Comments: Abrasion noted to right lateral tongue, no active bleeding. Eyes:     General: Lids are normal.     Conjunctiva/sclera: Conjunctivae normal.     Pupils: Pupils are equal, round, and reactive to light.  Neck:     Musculoskeletal: Normal range of motion and neck supple.  Cardiovascular:     Rate and Rhythm: Normal rate and regular rhythm.  Pulmonary:     Effort: Pulmonary effort is normal.     Breath sounds: Normal breath sounds.  Abdominal:     Palpations: Abdomen is soft.     Tenderness: There is no abdominal tenderness. There is no guarding or rebound.  Musculoskeletal: Normal range of motion.     Cervical back: He exhibits normal range of motion, no tenderness and no bony tenderness.  Skin:    General: Skin is warm and dry.  Neurological:     Mental Status: He is alert and oriented to person, place, and time.     GCS: GCS eye subscore is 4. GCS verbal subscore is 5. GCS motor subscore is 6.     Cranial Nerves: No cranial nerve deficit.     Sensory: No sensory deficit.     Motor: No abnormal muscle tone.     Coordination: Coordination normal.     Gait: Gait normal.     Deep Tendon Reflexes: Reflexes are normal and symmetric.      ED Treatments / Results  Labs (all labs ordered are listed, but only abnormal results are  displayed) Labs Reviewed  BASIC METABOLIC PANEL  CBC  CBG MONITORING, ED    EKG EKG Interpretation  Date/Time:  Thursday October 04 2018 08:13:16 EDT Ventricular Rate:  65 PR Interval:    QRS Duration: 90 QT Interval:  372 QTC Calculation: 387 R Axis:   22 Text Interpretation:  Sinus rhythm ST elev, probable normal early repol pattern no significant change since 2015 Confirmed by Pricilla Loveless [phone]) on 10/04/2018 8:20:59 AM   Radiology No results found.  Procedures Procedures (including critical care time)  Medications Ordered in  ED Medications - No data to display   Initial Impression / Assessment and Plan / ED Course  I have reviewed the triage vital signs and the nursing notes.  Pertinent labs & imaging results that were available during my care of the patient were reviewed by me and considered in my medical decision making (see chart for details).        Patient seen and examined. Work-up initiated. Discussed imaging with patient. As there is no obvious cause of procedure and no PCP, will obtain head CT today and monitor. Labs pending.    Vital signs reviewed and are as follows: BP 130/88 (BP Location: Right Arm)   Pulse 66   Temp (!) 97.4 F (36.3 C) (Oral)   Resp 20   Ht  (1.956 m)   Wt 97.5 kg   SpO2 99%   BMI 25.50 kg/m   10:16 AM work-up is unremarkable.  Patient be discharged home.  We did discuss CT findings and need for follow-up.  I have low concern that possible Chiari I malformation is contributing to the patient's symptoms today, but would benefit from neurology follow-up.  Referral made.  Encouraged return to the emergency department with recurrent seizures. Patient counseled to return if they have weakness in their arms or legs, slurred speech, trouble walking or talking, confusion, trouble with their balance, or if they have any other concerns. Patient verbalizes understanding and agrees with plan.   Discussed patient is not allowed to  drive by law until cleared by neurologist.  Final Clinical Impressions(s) / ED Diagnoses   Final diagnoses:  Seizure [facility])   Patient with suspected seizure today without any readily identifiable cause.  Lab work and CT imaging is negative.  Patient has had poor sleep recently getting only about 4 hours per night.  Encouraged return instructions as above.  No indication for hospitalization, emergent neurology consult, or antiepileptics at this point.  We did discuss at length, driving restrictions.  ED Discharge Orders    None       Renne Crigler, Cordelia Poche 10/04/18 1018    Pricilla Loveless, MD 10/04/18 1228

## 2018-10-04 NOTE — ED Triage Notes (Signed)
Pt in via [facility] from home with witnessed 2-3 minute grand mal seizure in front of family, pt noted to be postictal upon arrival of EMS, no tongue injury or urine & bowel incontinence, denies drug use and drinks once or twice a week per pt, CBG 96, HR 60s, A&O x4 upon arrival to ED, no seizure hx noted

## 2018-11-25 ENCOUNTER — Emergency Department (HOSPITAL_COMMUNITY): Payer: Self-pay

## 2018-11-25 ENCOUNTER — Observation Stay (HOSPITAL_COMMUNITY)
Admission: EM | Admit: 2018-11-25 | Discharge: 2018-11-26 | Disposition: A | Discharge status: Home / Self Care | Payer: Self-pay | Attending: Internal Medicine | Admitting: Internal Medicine

## 2018-11-25 ENCOUNTER — Other Ambulatory Visit: Payer: Self-pay

## 2018-11-25 DIAGNOSIS — F17210 Nicotine dependence, cigarettes, uncomplicated: Secondary | ICD-10-CM | POA: Insufficient documentation

## 2018-11-25 DIAGNOSIS — R202 Paresthesia of skin: Secondary | ICD-10-CM | POA: Insufficient documentation

## 2018-11-25 DIAGNOSIS — F1290 Cannabis use, unspecified, uncomplicated: Secondary | ICD-10-CM | POA: Insufficient documentation

## 2018-11-25 DIAGNOSIS — R569 Unspecified convulsions: Principal | ICD-10-CM

## 2018-11-25 DIAGNOSIS — F319 Bipolar disorder, unspecified: Secondary | ICD-10-CM | POA: Insufficient documentation

## 2018-11-25 DIAGNOSIS — Z1159 Encounter for screening for other viral diseases: Secondary | ICD-10-CM | POA: Insufficient documentation

## 2018-11-25 LAB — CBC WITH DIFFERENTIAL/PLATELET
Abs Immature Granulocytes: 0.02 10*3/uL (ref 0.00–0.07)
Basophils Absolute: 0 10*3/uL (ref 0.0–0.1)
Basophils Relative: 0 %
Eosinophils Absolute: 0 10*3/uL (ref 0.0–0.5)
Eosinophils Relative: 1 %
HCT: 43.2 % (ref 39.0–52.0)
Hemoglobin: 14.3 g/dL (ref 13.0–17.0)
Immature Granulocytes: 0 %
Lymphocytes Relative: 10 %
Lymphs Abs: 0.9 10*3/uL (ref 0.7–4.0)
MCH: 29.5 pg (ref 26.0–34.0)
MCHC: 33.1 g/dL (ref 30.0–36.0)
MCV: 89.1 fL (ref 80.0–100.0)
Monocytes Absolute: 0.5 10*3/uL (ref 0.1–1.0)
Monocytes Relative: 6 %
Neutro Abs: 6.9 10*3/uL (ref 1.7–7.7)
Neutrophils Relative %: 83 %
Platelets: 173 10*3/uL (ref 150–400)
RBC: 4.85 MIL/uL (ref 4.22–5.81)
RDW: 13.7 % (ref 11.5–15.5)
WBC: 8.3 10*3/uL (ref 4.0–10.5)
nRBC: 0 % (ref 0.0–0.2)

## 2018-11-25 LAB — COMPREHENSIVE METABOLIC PANEL
ALT: 17 U/L (ref 0–44)
AST: 27 U/L (ref 15–41)
Albumin: 3.7 g/dL (ref 3.5–5.0)
Alkaline Phosphatase: 45 U/L (ref 38–126)
Anion gap: 6 (ref 5–15)
BUN: 25 mg/dL — ABNORMAL HIGH (ref 6–20)
CO2: 20 mmol/L — ABNORMAL LOW (ref 22–32)
Calcium: 8.7 mg/dL — ABNORMAL LOW (ref 8.9–10.3)
Chloride: 110 mmol/L (ref 98–111)
Creatinine, Ser: 1.1 mg/dL (ref 0.61–1.24)
GFR calc Af Amer: >60 mL/min (ref >60)
GFR calc non Af Amer: >60 mL/min (ref >60)
Glucose, Bld: 83 mg/dL (ref 70–99)
Potassium: 4.7 mmol/L (ref 3.5–5.1)
Sodium: 136 mmol/L (ref 135–145)
Total Bilirubin: 0.8 mg/dL (ref 0.3–1.2)
Total Protein: 6 g/dL — ABNORMAL LOW (ref 6.5–8.1)

## 2018-11-25 LAB — SARS CORONAVIRUS 2 BY RT PCR (HOSPITAL ORDER, PERFORMED IN ~~LOC~~ HOSPITAL LAB): SARS Coronavirus 2: NEGATIVE

## 2018-11-25 MED ORDER — LORAZEPAM 2 MG/ML IJ SOLN
1.0000 mg | Freq: Once | INTRAMUSCULAR | Status: AC
  Administered 2018-11-25: 13:00:00 1 mg via INTRAVENOUS

## 2018-11-25 MED ORDER — LEVETIRACETAM IN NACL 1000 MG/100ML IV SOLN
1000.0000 mg | Freq: Once | INTRAVENOUS | Status: AC
  Administered 2018-11-25: 13:00:00 1000 mg via INTRAVENOUS
  Filled 2018-11-25: qty 100

## 2018-11-25 MED ORDER — HYDROCODONE-ACETAMINOPHEN 5-325 MG PO TABS
1.0000 | ORAL_TABLET | Freq: Once | ORAL | Status: AC
  Administered 2018-11-25: 12:00:00 1 via ORAL
  Filled 2018-11-25: qty 1

## 2018-11-25 MED ORDER — HYDROCODONE-ACETAMINOPHEN 5-325 MG PO TABS
2.0000 | ORAL_TABLET | Freq: Once | ORAL | Status: AC
  Administered 2018-11-26: 2 via ORAL
  Filled 2018-11-25: qty 2

## 2018-11-25 MED ORDER — LORAZEPAM 2 MG/ML IJ SOLN
1.0000 mg | Freq: Once | INTRAMUSCULAR | Status: AC
  Administered 2018-11-25: 1 mg via INTRAVENOUS
  Filled 2018-11-25: qty 1

## 2018-11-25 MED ORDER — ZOLPIDEM TARTRATE 5 MG PO TABS
5.0000 mg | ORAL_TABLET | Freq: Every evening | ORAL | Status: DC | PRN
  Administered 2018-11-26: 5 mg via ORAL
  Filled 2018-11-25: qty 1

## 2018-11-25 MED ORDER — LEVETIRACETAM IN NACL 1000 MG/100ML IV SOLN
1000.0000 mg | Freq: Two times a day (BID) | INTRAVENOUS | Status: DC
  Administered 2018-11-25 – 2018-11-26 (×2): 1000 mg via INTRAVENOUS
  Filled 2018-11-25 (×3): qty 100

## 2018-11-25 MED ORDER — SODIUM CHLORIDE 0.9 % IV BOLUS
500.0000 mL | Freq: Once | INTRAVENOUS | Status: AC
  Administered 2018-11-25: 08:00:00 500 mL via INTRAVENOUS

## 2018-11-25 MED ORDER — LORAZEPAM 2 MG/ML IJ SOLN
INTRAMUSCULAR | Status: AC
  Filled 2018-11-25: qty 1

## 2018-11-25 NOTE — H&P (Signed)
History and Physical    Todd Yoder UJW:[id number] DOB: Oct 20, 1985 DOA: 11/25/2018  I have briefly reviewed the patient's prior medical records in [facility]  PCP: Patient, No Pcp Per  Patient coming from: home  Chief Complaint: seizures  HPI: Todd Yoder is a 33 y.o. male with medical history significant of bipolar disorder not appearing to be on any medications, otherwise relatively healthy who presents to the hospital with complaints of seizure.  Patient was seen in the emergency room in March 2020 with a one-time seizure episode, negative work-up at that time and he was discharged home from the ED.  Patient was brought today with 2 generalized seizures at home witnessed by family, apparently first 1 while patient was asleep, and then a third witnessed convulsive seizure while getting an MRI that lasted about couple of minutes, per reports patient was foaming at the mouth and bit his tongue at that time.  He was given IV Ativan and Keppra, neurology was consulted and we are asked to admit. On my evaluation patient is alert and oriented x4, has no recollection of his seizure episodes.  He tells me he had one episode of seizure couple months ago but nothing since and nothing prior to that.  He reports family history of seizures in his mother, she developed them when she was in her 79s.  She states that she has had some lightheadedness the day prior, however he was working on moving and doing extraneous activity.  She smokes cigarettes and occasional marijuana.  She drinks occasionally, had some liquor last night with his father but prior to that alcohol consumption was several weeks back.  ED Course: In the emergency room he is afebrile, normotensive, satting well on room air.  Blood work is essentially unremarkable.  Urine drug screen positive for tetrahydrocannabinol. He underwent a CT scan of the brain with borderline findings for Chiari I malformation but otherwise unremarkable.   Review of Systems: As per HPI otherwise 10 point review of systems negative.   Past Medical History:  Diagnosis Date  . Bipolar disorder (HCC)   . Family history of adverse reaction to anesthesia    problem with aunt waking up after surgery  . GERD (gastroesophageal reflux disease)     Past Surgical History:  Procedure Laterality Date  . KNEE ARTHROSCOPY WITH MENISCAL REPAIR Right 09/19/2017   Procedure: KNEE ARTHROSCOPY WITH MENISCAL REPAIR;  Surgeon: Sheral Apley, MD;  Location: [facility];  Service: Orthopedics;  Laterality: Right;  . TOOTH EXTRACTION       reports that he has been smoking cigarettes. He has been smoking about 0.25 packs per day. He has never used smokeless tobacco. He reports current alcohol use. He reports current drug use. Drug: Marijuana.  Allergies  Allergen Reactions  . Tramadol Nausea And Vomiting  . Augmentin [Amoxicillin-Pot Clavulanate] Rash and Other (See Comments)    Has patient had a PCN reaction causing immediate rash, facial/tongue/throat swelling, SOB or lightheadedness with hypotension: No Has patient had a PCN reaction causing severe rash involving mucus membranes or skin necrosis: Yes Has patient had a PCN reaction that required hospitalization Yes Has patient had a PCN reaction occurring within the last 10 years: No If all of the above answers are "NO", then may proceed with Cephalosporin use.   . Nickel Rash    Burning   . Silver Rash    Burning     Family History  Problem Relation Age of Onset  .  Diabetes Mother   . Hypertension Mother   . Hypertension Father     Prior to Admission medications   Medication Sig Start Date End Date Taking? Authorizing Provider  Ascorbic Acid (VITAMIN C PO) Take 3 tablets by mouth daily.   Yes [provider]  BACLOFEN PO Take 1 tablet by mouth daily as needed (Back and Knee pain).    Yes [provider]  Biotin 1610910000 MCG TABS Take 10,000 mcg by mouth daily.    Yes [provider]  guaiFENesin (MUCINEX) 600 MG 12 hr tablet Take 600 mg by mouth 2 (two) times daily as needed (Congestion).   Yes [provider]  Multiple Vitamin (MULTIVITAMIN WITH MINERALS) TABS tablet Take 1 tablet by mouth daily.   Yes [provider]  naproxen sodium (ALEVE) 220 MG tablet Take 440 mg by mouth daily as needed (pain).   Yes [provider]  Pseudoeph-Doxylamine-DM-APAP (NYQUIL PO) Take 1 Dose by mouth daily as needed (Itchy Throat).   Yes [provider]  docusate sodium (COLACE) 100 MG capsule Take 1 capsule (100 mg total) by mouth 2 (two) times daily. To prevent constipation while taking pain medication. Patient not taking: Reported on 11/25/2018 09/19/17   Albina BilletMartensen, Henry Calvin III, PA-C  doxycycline (VIBRAMYCIN) 100 MG capsule Take 1 capsule (100 mg total) by mouth 2 (two) times daily. Patient not taking: Reported on 11/25/2018 09/05/18   Cathren LaineSteinl, Kevin, MD  HYDROcodone-acetaminophen [facility](NORCO) 5-325 MG tablet Take 1-2 tablets by mouth every 6 (six) hours as needed for moderate pain. Patient not taking: Reported on 11/25/2018 09/19/17   Albina BilletMartensen, Henry Calvin III, PA-C  methocarbamol (ROBAXIN) 500 MG tablet Take 1 tablet (500 mg total) by mouth every 8 (eight) hours as needed for muscle spasms. Patient not taking: Reported on 11/25/2018 09/19/17   Albina BilletMartensen, Henry Calvin III, PA-C  ondansetron (ZOFRAN) 4 MG tablet Take 1 tablet (4 mg total) by mouth every 8 (eight) hours as needed for nausea or vomiting. Patient not taking: Reported on 11/25/2018 09/19/17   Albina BilletMartensen, Henry Calvin III, PA-C    Physical Exam: Vitals:   11/25/18 0704 11/25/18 0930 11/25/18 1115 11/25/18 1433  BP: 130/84 (!) 129/93 138/87 105/76  Pulse: 64 (!) 56  75  Resp: 18 18 12  (!) 24  Temp: 97.9 F (36.6 C)     TempSrc: Oral     SpO2: 96% 99% 100% 98%   Constitutional: NAD, calm, comfortable Eyes: PERRL, lids and conjunctivae normal ENMT: Mucous membranes  are moist.  Neck: normal, supple, no masses, no thyromegaly Respiratory: clear to auscultation bilaterally, no wheezing, no crackles. Normal respiratory effort.  Cardiovascular: Regular rate and rhythm, no murmurs / rubs / gallops. No extremity edema. Abdomen: no tenderness, no masses palpated. Bowel sounds positive.  Musculoskeletal: no clubbing / cyanosis. Normal muscle tone.  Skin: no rashes, lesions, ulcers. No induration Neurologic: CN 2-12 grossly intact. Strength 5/5 in all 4.  Psychiatric: Normal judgment and insight. Alert and oriented x 3. Normal mood.   Labs on Admission: I have personally reviewed following labs and imaging studies  CBC: Recent Labs  Lab 11/25/18 0751  WBC 8.3  NEUTROABS 6.9  HGB 14.3  HCT 43.2  MCV 89.1  PLT 173   Basic Metabolic Panel: Recent Labs  Lab 11/25/18 0751  NA 136  K 4.7  CL 110  CO2 20*  GLUCOSE 83  BUN 25*  CREATININE 1.10  CALCIUM 8.7*   GFR: CrCl cannot be calculated (Unknown ideal  weight.). Liver Function Tests: Recent Labs  Lab 11/25/18 0751  AST 27  ALT 17  ALKPHOS 45  BILITOT 0.8  PROT 6.0*  ALBUMIN 3.7   No results for input(s): LIPASE, AMYLASE in the last 168 hours. No results for input(s): AMMONIA in the last 168 hours. Coagulation Profile: No results for input(s): INR, PROTIME in the last 168 hours. Cardiac Enzymes: No results for input(s): CKTOTAL, CKMB, CKMBINDEX, TROPONINI in the last 168 hours. BNP (last 3 results) No results for input(s): PROBNP in the last 8760 hours. HbA1C: No results for input(s): HGBA1C in the last 72 hours. CBG: No results for input(s): GLUCAP in the last 168 hours. Lipid Profile: No results for input(s): CHOL, HDL, LDLCALC, TRIG, CHOLHDL, LDLDIRECT in the last 72 hours. Thyroid Function Tests: No results for input(s): TSH, T4TOTAL, FREET4, T3FREE, THYROIDAB in the last 72 hours. Anemia Panel: No results for input(s): VITAMINB12, FOLATE, FERRITIN, TIBC, IRON, RETICCTPCT in  the last 72 hours. Urine analysis:    Component Value Date/Time   COLORURINE YELLOW 09/20/2015 1130   APPEARANCEUR CLEAR 09/20/2015 1130   LABSPEC 1.038 (H) 09/20/2015 1130   PHURINE 6.5 09/20/2015 1130   GLUCOSEU NEGATIVE 09/20/2015 1130   HGBUR NEGATIVE 09/20/2015 1130   BILIRUBINUR NEGATIVE 09/20/2015 1130   KETONESUR 15 (A) 09/20/2015 1130   PROTEINUR 100 (A) 09/20/2015 1130   NITRITE NEGATIVE 09/20/2015 1130   LEUKOCYTESUR NEGATIVE 09/20/2015 1130     Radiological Exams on Admission: No results found.  EKG: Independently reviewed.  EKG shows sinus rhythm   Assessment/Plan Active Problems:   Seizures (HCC)   Principal Problem Seizures -The patient with an initial seizure couple of months ago with a negative work-up at that time and placed on no AEDs.  He comes back with back-to-back generalized seizures x3, 2 at home as well as 1 in the emergency room. -Neurology consulted, appreciate input, will admit patient to stepdown, continue Keppra as per neurology recommendations -MRI of the brain and the C-spine are pending.  EEG is pending as well.  Active Problems Marijuana use -We will discuss with patient tomorrow regarding cessation  DVT prophylaxis: SCDs Code Status: Presumed full code Family Communication: No family at bedside Disposition Plan: Admit to stepdown, likely home when ready Consults called: Neurology   Pamella Pert, MD, PhD [facility]  Contact via [email or web address]  [facility] Office Info P: [phone]  F: [phone]   11/25/2018, 5:53 PM

## 2018-11-25 NOTE — Consult Note (Addendum)
NEUROLOGY CONSULT  Reason for Consult: Neruologic opinion for seizures Referring Physician: ER CC: none per pt  HPI: Todd Yoder is an 33 y.o. male 33yr old male with min PMH, GERD, bipolar. He had a first time seizure 1 mo ago. He was seen in the ER at that time and d/c home to f/u with out pt neuro. No ASD started at that time. Pt returns today with 2 generalized seizures at home witnessed by family and then a 3rd witnessed convulsive seizure while in MRI scanner that lasted around 1-2 minutes, pt foaming at mouth and bit his tongue. IV 2mg  Ativan given and IV 1000mg  Keppra given. Pt is now slow to respond and confused in post ictal state.   Past Medical History Past Medical History:  Diagnosis Date  . Bipolar disorder (HCC)   . Family history of adverse reaction to anesthesia    problem with aunt waking up after surgery  . GERD (gastroesophageal reflux disease)     Past Surgical History Past Surgical History:  Procedure Laterality Date  . KNEE ARTHROSCOPY WITH MENISCAL REPAIR Right 09/19/2017   Procedure: KNEE ARTHROSCOPY WITH MENISCAL REPAIR;  Surgeon: Sheral Apley, MD;  Location: [facility];  Service: Orthopedics;  Laterality: Right;  . TOOTH EXTRACTION      Family History Family History  Problem Relation Age of Onset  . Diabetes Mother   . Hypertension Mother   . Hypertension Father     Social History    reports that he has been smoking cigarettes. He has been smoking about 0.25 packs per day. He has never used smokeless tobacco. He reports current alcohol use. He reports current drug use. Drug: Marijuana.  Allergies Allergies  Allergen Reactions  . Tramadol Nausea And Vomiting  . Augmentin [Amoxicillin-Pot Clavulanate] Rash and Other (See Comments)    Has patient had a PCN reaction causing immediate rash, facial/tongue/throat swelling, SOB or lightheadedness with hypotension: No Has patient had a PCN reaction causing severe rash involving mucus  membranes or skin necrosis: Yes Has patient had a PCN reaction that required hospitalization Yes Has patient had a PCN reaction occurring within the last 10 years: No If all of the above answers are "NO", then may proceed with Cephalosporin use.   . Nickel Rash    Burning   . Silver Rash    Burning     Home Medications (Not in a hospital admission)   Hospital Medications    JTT:[id number]  Physical Examination:  Vitals:   11/25/18 0704 11/25/18 0930 11/25/18 1115  BP: 130/84 (!) 129/93 138/87  Pulse: 64 (!) 56   Resp: 18 18 12   Temp: 97.9 F (36.6 C)    TempSrc: Oral    SpO2: 96% 99% 100%    General - mild distress; lethargic Heart - Regular rate and rhythm - no murmer Lungs - Clear to auscultation Abdomen - Soft - non tender Extremities - Distal pulses intact - no edema Skin - Warm and dry  Neurologic Examination:   Mental Status:  Lethargic, but will awaken to repeat tactile/voice stimuli. Oriented to name, knows he is in ER, but tells me the date is DEC 2020. Speech is min and slow, disorganized and dysarthric.Follows simple commands with considerable psychomotor slowing Cranial Nerves:  II-bilateral visual fields intact III/IV/VI-Pupils were equal and reacted. Extraocular movements were full.  V/VII-no facial numbness and no facial weakness.  VIII-hearing normal.  X-normal speech and symmetrical palatal movement.  XII-midline tongue extension; swollen from  tongue biting during seizure Motor: Right : Upper extremity   5/5    Left:     Upper extremity   5/5  Lower extremity   5/5     Lower extremity   5/5 Tone and bulk:normal tone throughout; no atrophy noted Sensory: Intact to light touch in all extremities. Deep Tendon Reflexes: 2/4 throughout Plantars: Downgoing bilaterally  Cerebellar: Not tested Gait: not tested   LABORATORY STUDIES:  Basic Metabolic Panel: Recent Labs  Lab 11/25/18 0751  NA 136  K 4.7  CL 110  CO2 20*  GLUCOSE 83  BUN 25*   CREATININE 1.10  CALCIUM 8.7*    Liver Function Tests: Recent Labs  Lab 11/25/18 0751  AST 27  ALT 17  ALKPHOS 45  BILITOT 0.8  PROT 6.0*  ALBUMIN 3.7   No results for input(s): LIPASE, AMYLASE in the last 168 hours. No results for input(s): AMMONIA in the last 168 hours.  CBC: Recent Labs  Lab 11/25/18 0751  WBC 8.3  NEUTROABS 6.9  HGB 14.3  HCT 43.2  MCV 89.1  PLT 173    Cardiac Enzymes: No results for input(s): CKTOTAL, CKMB, CKMBINDEX, TROPONINI in the last 168 hours.  BNP: Invalid input(s): POCBNP  CBG: No results for input(s): GLUCAP in the last 168 hours.  Microbiology:   Coagulation Studies: No results for input(s): LABPROT, INR in the last 72 hours.  Urinalysis: No results for input(s): COLORURINE, LABSPEC, PHURINE, GLUCOSEU, HGBUR, BILIRUBINUR, KETONESUR, PROTEINUR, UROBILINOGEN, NITRITE, LEUKOCYTESUR in the last 168 hours.  Invalid input(s): APPERANCEUR  Lipid Panel:  No results found for: CHOL, TRIG, HDL, CHOLHDL, VLDL, LDLCALC  HgbA1C:  No results found for: HGBA1C  Urine Drug Screen:      Component Value Date/Time   LABOPIA NONE DETECTED 10/04/2018 0855   COCAINSCRNUR NONE DETECTED 10/04/2018 0855   LABBENZ NONE DETECTED 10/04/2018 0855   AMPHETMU NONE DETECTED 10/04/2018 0855   THCU POSITIVE (A) 10/04/2018 0855   LABBARB NONE DETECTED 10/04/2018 0855     Alcohol Level:  No results for input(s): ETH in the last 168 hours.  Miscellaneous labs:  EKG  EKG  IMAGING: No results found.   Assessment/Plan: [age]yr old male with first time seizure 1 mo ago. He was seen in the ER at that time and d/c home to f/u with out pt neuro. No ASD started at that time. Pt returns today with 2 seizures at home and then a 3rd witnessed convulsive seizure while in MRI scanner that started at 12:55pm and lasted around 1-2 minutes, pt foaming at mouth and bit his tongue. Pt is now slow to respond and confused in post ictal state.  # Seizures-  multiple general convulsions today. EEG and MRI to further evaluate. Con't keppra # Encephalopathy- d/t post ictal lethargy, confusion  RECS: EEG MRI brain when able keppra 1000mg  BID IV till able to tol PO NPO while lethargic/post ictal Seizure precautions  Attending neurologist's note to follow  I have seen the patient reviewed the above note.  His mental status has improved compared to [facility]  He is able to tell me and that he gets occasional shocklike sensations that affect his entire body and happen from time to time.  He denies early morning myoclonus.  His mother had epilepsy since childhood.  I have heard patient is complaining of something similar with idiopathic generalized epilepsy, given his family history I think that this is a possibility.  Given that  he has had seizures on more than one occasion, he does merit antiepileptic therapy at this point and has been started on Keppra.  Awaiting EEG, given Chiari malformation, getting MRI brain and C-spine.  Ritta Slot, MD [facility] [phone]  If 7pm- 7am, please page neurology on call as listed in AMION.

## 2018-11-25 NOTE — ED Triage Notes (Signed)
Pt brought in by ems for c/o witnessed seizures during his sleep according to mother , 1st episode lasted 3-4 min and the second one lasted 2 min ; upon ems arrival , pt was post ictal ; pt had 1 st seizure about 1 month ago and has been trying to get an appointment with neurologist but hasn't been able to get one ; pt now alert and oriented x 4 and following simple commands , pt states his right shoulder feels sore but has full movement

## 2018-11-25 NOTE — Significant Event (Signed)
Rapid Response Event Note  Overview: RRT to MRI  Initial Focused Assessment: RT and RR RN arrived to MRI, patient was in holding bay, patient came from ED to MRI, ED RN was called and came to the MRI as well. Per MRI techs, patient had a seizure lasting about 1-2 minutes, + bloody oral secretions when RR Team arrived, 95% on RA and HR of 96. Appears post ictal, ED RN gave Ativan IV and Keppra IV, ED RN took patient back to ED.  Interventions: -- NO RRT INTERVENTIONS  Plan of Care: -- None   Event Summary:  Call Time 1300 Arrival Time 1302  Todd Yoder R

## 2018-11-25 NOTE — ED Notes (Signed)
Patient going to MRI

## 2018-11-25 NOTE — Progress Notes (Signed)
During MRI brain scan, pt squeezed call buzzer stating  He was profusely sweating and could not stop and he wanted to come out of the machine for some air, I went in to bring him out of the scanner and he started having a convulsive seizure that started at 12:55pm and lasted around 1-2 minutes, pt foaming at mouth and bit his tongue, pt rushed out of the MRI suite while [facility] was called for assistance. Rapid took pt back to ED, will call when pt condition is better to get his scans

## 2018-11-25 NOTE — ED Notes (Signed)
ED TO INPATIENT HANDOFF REPORT  ED Nurse Name and Phone #: Marcelina Morel [phone]  S Name/Age/Gender Toribio Harbour 33 y.o. male Room/Bed: 020C/020C  Code Status   Code Status: Not on file  Home/SNF/Other Home Patient oriented to: self, place, time and situation Is this baseline? Yes   Triage Complete: Triage complete  Chief Complaint seizure  Triage Note Pt brought in by ems for c/o witnessed seizures during his sleep according to mother , 1st episode lasted 3-4 min and the second one lasted 2 min ; upon ems arrival , pt was post ictal ; pt had 1 st seizure about 1 month ago and has been trying to get an appointment with neurologist but hasn't been able to get one ; pt now alert and oriented x 4 and following simple commands , pt states his right shoulder feels sore but has full movement    Allergies Allergies  Allergen Reactions  . Tramadol Nausea And Vomiting  . Augmentin [Amoxicillin-Pot Clavulanate] Rash and Other (See Comments)    Has patient had a PCN reaction causing immediate rash, facial/tongue/throat swelling, SOB or lightheadedness with hypotension: No Has patient had a PCN reaction causing severe rash involving mucus membranes or skin necrosis: Yes Has patient had a PCN reaction that required hospitalization Yes Has patient had a PCN reaction occurring within the last 10 years: No If all of the above answers are "NO", then may proceed with Cephalosporin use.   . Nickel Rash    Burning   . Silver Rash    Burning     Level of Care/Admitting Diagnosis ED Disposition    ED Disposition Condition Comment   Admit  Hospital Area: [facility] [[id number]]  Level of Care: Progressive [102]  I expect the patient will be discharged within 24 hours: No (not a candidate for 5C-Observation unit)  Covid Evaluation: Screening Protocol (No Symptoms)  Diagnosis: Seizures (HCC) [397673]  Admitting Physician: Leatha Gilding [phone]  Attending Physician:  Leatha Gilding [[id number]]  PT Class (Do Not Modify): Observation [104]  PT Acc Code (Do Not Modify): Observation [10022]       B Medical/Surgery History Past Medical History:  Diagnosis Date  . Bipolar disorder (HCC)   . Family history of adverse reaction to anesthesia    problem with aunt waking up after surgery  . GERD (gastroesophageal reflux disease)    Past Surgical History:  Procedure Laterality Date  . KNEE ARTHROSCOPY WITH MENISCAL REPAIR Right 09/19/2017   Procedure: KNEE ARTHROSCOPY WITH MENISCAL REPAIR;  Surgeon: Sheral Apley, MD;  Location: [facility];  Service: Orthopedics;  Laterality: Right;  . TOOTH EXTRACTION       A IV Location/Drains/Wounds Patient Lines/Drains/Airways Status   Active Line/Drains/Airways    Name:   Placement date:   Placement time:   Site:   Days:   Peripheral IV 11/25/18 Left Antecubital   11/25/18    -    Antecubital   less than 1   Incision (Closed) 09/19/17 Leg Right   09/19/17    1337     432          Intake/Output Last 24 hours  Intake/Output Summary (Last 24 hours) at 11/25/2018 1837 Last data filed at 11/25/2018 1330 Gross per 24 hour  Intake 600 ml  Output -  Net 600 ml    Labs/Imaging Results for orders placed or performed during the hospital encounter of 11/25/18 (from the past 48 hour(s))  CBC with  Differential     Status: None   Collection Time: 11/25/18  7:51 AM  Result Value Ref Range   WBC 8.3 4.0 - 10.5 K/uL   RBC 4.85 4.22 - 5.81 MIL/uL   Hemoglobin 14.3 13.0 - 17.0 g/dL   HCT 11.9 14.7 - 82.9 %   MCV 89.1 80.0 - 100.0 fL   MCH 29.5 26.0 - 34.0 pg   MCHC 33.1 30.0 - 36.0 g/dL   RDW 56.2 13.0 - 86.5 %   Platelets 173 150 - 400 K/uL   nRBC 0.0 0.0 - 0.2 %   Neutrophils Relative % 83 %   Neutro Abs 6.9 1.7 - 7.7 K/uL   Lymphocytes Relative 10 %   Lymphs Abs 0.9 0.7 - 4.0 K/uL   Monocytes Relative 6 %   Monocytes Absolute 0.5 0.1 - 1.0 K/uL   Eosinophils Relative 1 %   Eosinophils  Absolute 0.0 0.0 - 0.5 K/uL   Basophils Relative 0 %   Basophils Absolute 0.0 0.0 - 0.1 K/uL   Immature Granulocytes 0 %   Abs Immature Granulocytes 0.02 0.00 - 0.07 K/uL    Comment: Performed at [facility], [address] [place], [place] [address]  Comprehensive metabolic panel     Status: Abnormal   Collection Time: 11/25/18  7:51 AM  Result Value Ref Range   Sodium 136 135 - 145 mmol/L   Potassium 4.7 3.5 - 5.1 mmol/L   Chloride 110 98 - 111 mmol/L   CO2 20 (L) 22 - 32 mmol/L   Glucose, Bld 83 70 - 99 mg/dL   BUN 25 (H) 6 - 20 mg/dL   Creatinine, Ser 6.29 0.61 - 1.24 mg/dL   Calcium 8.7 (L) 8.9 - 10.3 mg/dL   Total Protein 6.0 (L) 6.5 - 8.1 g/dL   Albumin 3.7 3.5 - 5.0 g/dL   AST 27 15 - 41 U/L   ALT 17 0 - 44 U/L   Alkaline Phosphatase 45 38 - 126 U/L   Total Bilirubin 0.8 0.3 - 1.2 mg/dL   GFR calc non Af Amer >60 >60 mL/min   GFR calc Af Amer >60 >60 mL/min   Anion gap 6 5 - 15    Comment: Performed at [facility], [address] [place], [place] [address]   Mr Brain Wo Contrast  Result Date: 11/25/2018 CLINICAL DATA:  New seizure.  Paresthesia. EXAM: MRI HEAD WITHOUT CONTRAST MRI CERVICAL SPINE WITHOUT CONTRAST TECHNIQUE: Multiplanar, multiecho pulse sequences of the brain and surrounding structures, and cervical spine, to include the craniocervical junction and cervicothoracic junction, were obtained without intravenous contrast. COMPARISON:  CT head 10/04/2018 FINDINGS: MRI HEAD FINDINGS Brain: Ventricle size normal. Negative for acute or chronic infarction. Negative for demyelinating disease. Negative for hemorrhage or mass Chiari malformation. Cerebellar tonsils extend 11 mm below the foramen magnum and show mild impaction. Vascular: Normal arterial flow voids Skull and upper cervical spine: Negative Sinuses/Orbits: Negative Other: None MRI CERVICAL SPINE FINDINGS Alignment: Normal Vertebrae: Normal bone marrow.  Negative for fracture or mass Cord: Normal  cord signal.  No cord compression. Posterior Fossa, vertebral arteries, paraspinal tissues: Chiari malformation as above. No mass in the neck Disc levels: Negative for disc degeneration. Negative for disc protrusion or stenosis. IMPRESSION: 1. No acute intracranial abnormality 2. 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 Negative for hydrocephalus 3. Negative MRI cervical spine.  Negative for syrinx Electronically Signed   By: Leonette Most  Chestine Sporelark M.D.   On: 11/25/2018 18:26   Mr Cervical Spine Wo Contrast  Result Date: 11/25/2018 CLINICAL DATA:  New seizure.  Paresthesia. EXAM: MRI HEAD WITHOUT CONTRAST MRI CERVICAL SPINE WITHOUT CONTRAST TECHNIQUE: Multiplanar, multiecho pulse sequences of the brain and surrounding structures, and cervical spine, to include the craniocervical junction and cervicothoracic junction, were obtained without intravenous contrast. COMPARISON:  CT head 10/04/2018 FINDINGS: MRI HEAD FINDINGS Brain: Ventricle size normal. Negative for acute or chronic infarction. Negative for demyelinating disease. Negative for hemorrhage or mass Chiari malformation. Cerebellar tonsils extend 11 mm below the foramen magnum and show mild impaction. Vascular: Normal arterial flow voids Skull and upper cervical spine: Negative Sinuses/Orbits: Negative Other: None MRI CERVICAL SPINE FINDINGS Alignment: Normal Vertebrae: Normal bone marrow.  Negative for fracture or mass Cord: Normal cord signal.  No cord compression. Posterior Fossa, vertebral arteries, paraspinal tissues: Chiari malformation as above. No mass in the neck Disc levels: Negative for disc degeneration. Negative for disc protrusion or stenosis. IMPRESSION: 1. No acute intracranial abnormality 2. 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 Negative for hydrocephalus 3. Negative MRI cervical spine.  Negative for syrinx Electronically Signed   By: Marlan Palauharles  Clark M.D.    On: 11/25/2018 18:26    Pending Labs Unresulted Labs (From admission, onward)    Start     Ordered   11/25/18 1735  SARS Coronavirus 2 (CEPHEID - [facility]), Hosp Order  (Asymptomatic Patients Labs)  Once,   R    Question:  Rule Out  Answer:  Yes   11/25/18 1734   Signed and Held  HIV antibody (Routine Testing)  Once,   R     Signed and Held          Vitals/Pain Today's Vitals   11/25/18 1433 11/25/18 1435 11/25/18 1836 11/25/18 1837  BP: 105/76  139/77   Pulse: 75  82   Resp: (!) 24  16   Temp:      TempSrc:      SpO2: 98%  99%   PainSc:  0-No pain  5     Isolation Precautions No active isolations  Medications Medications  levETIRAcetam (KEPPRA) IVPB 1000 mg/100 mL premix (has no administration in time range)  sodium chloride 0.9 % bolus 500 mL (0 mLs Intravenous Stopped 11/25/18 0931)  HYDROcodone-acetaminophen (NORCO/VICODIN) 5-325 MG per tablet 1 tablet (1 tablet Oral Given 11/25/18 1143)  LORazepam (ATIVAN) injection 1 mg (1 mg Intravenous Given 11/25/18 1302)  levETIRAcetam (KEPPRA) IVPB 1000 mg/100 mL premix (0 mg Intravenous Stopped 11/25/18 1330)  LORazepam (ATIVAN) injection 1 mg (1 mg Intravenous Given 11/25/18 1302)  LORazepam (ATIVAN) injection 1 mg (1 mg Intravenous Given 11/25/18 1638)    Mobility walks Low fall risk   Focused Assessments Neuro Assessment Handoff:  Swallow screen pass? Yes          Neuro Assessment: Exceptions to WDL Neuro Checks:      Last Documented NIHSS Modified Score:   Has TPA been given? No If patient is a Neuro Trauma and patient is going to OR before floor call report to 4N Charge nurse: [phone] or [phone]     R Recommendations: See Admitting Provider Note  Report given to:   Additional Notes:

## 2018-11-25 NOTE — ED Notes (Signed)
Per MRI , patient had a full body seizure that lasted about 1 minute , Dr. teggler aware and ok to administer 2mg  of ativan IV

## 2018-11-25 NOTE — ED Notes (Signed)
1mg  Ativan given. MD gave verbal to give a 2nd dose of 1mg . See MAR.

## 2018-11-25 NOTE — ED Provider Notes (Signed)
[facility] Provider Note   CSN: [id number] Arrival date & time: 11/25/18  0700    History   Chief Complaint Chief Complaint  Patient presents with   Seizures    HPI Todd Yoder is a 33 y.o. male.     The history is provided by the patient, a parent and medical records (father on phone).  Seizures  Seizure activity on arrival: no   Seizure type:  Grand mal Initial focality:  None Episode characteristics: generalized shaking   Postictal symptoms: confusion and somnolence   Return to baseline: yes   Severity:  Moderate Duration:  4 minutes Timing:  Clustered Number of seizures this episode:  2 Progression:  Unable to specify Context: decreased sleep, drug use (marijuana) and stress   Context: not alcohol withdrawal, not cerebral palsy, not change in medication, not fever, not intracranial lesion, not intracranial shunt, medical compliance, not possible hypoglycemia, not possible medication ingestion and not previous head injury   Recent head injury:  No recent head injuries PTA treatment:  None History of seizures: yes (on seizure in march)   Similar to previous episodes: yes     Past Medical History:  Diagnosis Date   Bipolar disorder (HCC)    Family history of adverse reaction to anesthesia    problem with aunt waking up after surgery   GERD (gastroesophageal reflux disease)     There are no active problems to display for this patient.   Past Surgical History:  Procedure Laterality Date   KNEE ARTHROSCOPY WITH MENISCAL REPAIR Right 09/19/2017   Procedure: KNEE ARTHROSCOPY WITH MENISCAL REPAIR;  Surgeon: Sheral ApleyMurphy, Timothy D, MD;  Location: [facility];  Service: Orthopedics;  Laterality: Right;   TOOTH EXTRACTION          Home Medications    Prior to Admission medications   Medication Sig Start Date End Date Taking? Authorizing Provider  docusate sodium (COLACE) 100 MG capsule Take 1 capsule  (100 mg total) by mouth 2 (two) times daily. To prevent constipation while taking pain medication. 09/19/17   Albina BilletMartensen, Henry Calvin III, PA-C  doxycycline (VIBRAMYCIN) 100 MG capsule Take 1 capsule (100 mg total) by mouth 2 (two) times daily. 09/05/18   Cathren LaineSteinl, Kevin, MD  HYDROcodone-acetaminophen (NORCO) 5-325 MG tablet Take 1-2 tablets by mouth every 6 (six) hours as needed for moderate pain. 09/19/17   Martensen, Lucretia KernHenry Calvin III, PA-C  methocarbamol (ROBAXIN) 500 MG tablet Take 1 tablet (500 mg total) by mouth every 8 (eight) hours as needed for muscle spasms. 09/19/17   Albina BilletMartensen, Henry Calvin III, PA-C  ondansetron (ZOFRAN) 4 MG tablet Take 1 tablet (4 mg total) by mouth every 8 (eight) hours as needed for nausea or vomiting. 09/19/17   Martensen, Lucretia KernHenry Calvin III, PA-C    Family History Family History  Problem Relation Age of Onset   Diabetes Mother    Hypertension Mother    Hypertension Father     Social History Social History   Tobacco Use   Smoking status: Current Some Day Smoker    Packs/day: 0.25    Types: Cigarettes   Smokeless tobacco: Never Used  Substance Use Topics   Alcohol use: Yes    Comment: occasional   Drug use: Yes    Types: Marijuana     Allergies   Augmentin [amoxicillin-pot clavulanate]; Nickel; and Silver   Review of Systems Review of Systems  Constitutional: Negative for chills, diaphoresis, fatigue and fever.  HENT:  Negative for congestion and rhinorrhea.   Eyes: Positive for visual disturbance (blurry vision yesterday for 3 min).  Respiratory: Negative for cough, chest tightness, shortness of breath and wheezing.   Cardiovascular: Negative for chest pain and palpitations.  Gastrointestinal: Negative for abdominal distention, abdominal pain, constipation, diarrhea, nausea and vomiting.  Genitourinary: Negative for dysuria and flank pain.  Musculoskeletal: Negative for back pain, neck pain and neck stiffness.  Skin: Negative for rash and  wound.  Neurological: Positive for seizures. Negative for light-headedness and headaches.  Psychiatric/Behavioral: Negative for agitation.  All other systems reviewed and are negative.    Physical Exam Updated Vital Signs BP 130/84 (BP Location: Right Arm)    Pulse 64    Temp 97.9 F (36.6 C) (Oral)    Resp 18    SpO2 96%   Physical Exam Vitals signs and nursing note reviewed.  Constitutional:      General: He is not in acute distress.    Appearance: He is well-developed. He is not ill-appearing, toxic-appearing or diaphoretic.  HENT:     Head: Normocephalic and atraumatic.     Nose: No congestion or rhinorrhea.     Mouth/Throat:     Mouth: Mucous membranes are moist.     Pharynx: No oropharyngeal exudate or posterior oropharyngeal erythema.  Eyes:     Extraocular Movements: Extraocular movements intact.     Conjunctiva/sclera: Conjunctivae normal.     Pupils: Pupils are equal, round, and reactive to light.  Neck:     Musculoskeletal: Neck supple. No muscular tenderness.  Cardiovascular:     Rate and Rhythm: Normal rate and regular rhythm.     Pulses: Normal pulses.     Heart sounds: No murmur.  Pulmonary:     Effort: Pulmonary effort is normal. No respiratory distress.     Breath sounds: Normal breath sounds.  Abdominal:     Palpations: Abdomen is soft.     Tenderness: There is no abdominal tenderness. There is no right CVA tenderness or left CVA tenderness.  Musculoskeletal:        General: No tenderness.     Right lower leg: No edema.     Left lower leg: No edema.  Skin:    General: Skin is warm and dry.     Capillary Refill: Capillary refill takes less than 2 seconds.     Findings: No rash.  Neurological:     General: No focal deficit present.     Mental Status: He is alert and oriented to person, place, and time.     Cranial Nerves: No cranial nerve deficit.     Sensory: No sensory deficit.     Motor: No weakness.  Psychiatric:        Mood and Affect: Mood  normal.      ED Treatments / Results  Labs (all labs ordered are listed, but only abnormal results are displayed) Labs Reviewed  COMPREHENSIVE METABOLIC PANEL - Abnormal; Notable for the following components:      Result Value   CO2 20 (*)    BUN 25 (*)    Calcium 8.7 (*)    Total Protein 6.0 (*)    All other components within normal limits  CBC WITH DIFFERENTIAL/PLATELET    EKG EKG Interpretation  Date/Time:  Sunday Nov 25 2018 07:04:11 EDT Ventricular Rate:  69 PR Interval:    QRS Duration: 93 QT Interval:  370 QTC Calculation: 397 R Axis:   22 Text Interpretation:  Sinus rhythm Probable  left atrial enlargement When compared to prior, no significant changes seen.  No STEMI Confirmed by Theda Belfast ([id number]) on 11/25/2018 7:07:04 AM   Radiology No results found.  Procedures Procedures (including critical care time)  CRITICAL CARE Performed by: Canary Brim Arrion Broaddus Total critical care time: 35 minutes Critical care time was exclusive of separately billable procedures and treating other patients. Critical care was necessary to treat or prevent imminent or life-threatening deterioration. Critical care was time spent personally by me on the following activities: development of treatment plan with patient and/or surrogate as well as nursing, discussions with consultants, evaluation of patient's response to treatment, examination of patient, obtaining history from patient or surrogate, ordering and performing treatments and interventions, ordering and review of laboratory studies, ordering and review of radiographic studies, pulse oximetry and re-evaluation of patient's condition.   Medications Ordered in ED Medications  levETIRAcetam (KEPPRA) IVPB 1000 mg/100 mL premix (has no administration in time range)  sodium chloride 0.9 % bolus 500 mL (0 mLs Intravenous Stopped 11/25/18 0931)  HYDROcodone-acetaminophen (NORCO/VICODIN) 5-325 MG per tablet 1 tablet (1 tablet Oral  Given 11/25/18 1143)  LORazepam (ATIVAN) injection 1 mg (1 mg Intravenous Given 11/25/18 1302)  levETIRAcetam (KEPPRA) IVPB 1000 mg/100 mL premix (0 mg Intravenous Stopped 11/25/18 1330)  LORazepam (ATIVAN) injection 1 mg (1 mg Intravenous Given 11/25/18 1302)     Initial Impression / Assessment and Plan / ED Course  I have reviewed the triage vital signs and the nursing notes.  Pertinent labs & imaging results that were available during my care of the patient were reviewed by me and considered in my medical decision making (see chart for details).        EFRAM ROATH is a 33 y.o. male with a past medical history significant for GERD, bipolar disorder, and prior seizure 2 months ago who presents with seizures.  Patient reportedly had a seizure back in March and was discharged home for outpatient neurology follow-up.  He reports that due to the coronavirus pandemic, the neurology office has been unable to schedule an appointment for him.  He reports that he has not had any seizures until overnight when he had 2 seizures.  Family reports that patient had a pair of seizures the first lasting 3 to 4 minutes and the second lasting approximately 2 minutes.  Upon EMS arrival, they reported that patient was postictal but no longer having seizures.  Patient reports that he has not slept well the last 2 days which was felt to be the cause of his previous seizure.  He reports that he recently has moved an increase in stress.  He reports that he had 2 drinks of alcohol yesterday and occasionally smokes marijuana.  No other drug use and no other medication use.  He denies history of alcohol withdrawal seizures and has not changed his alcohol intake recently.  He denies any trauma.  He reports that the only abnormal symptom yesterday was that he had 3 events of blurry vision that went away.  He felt it was because he was fatigued and tired.  He denies any nausea, vomiting, current vision changes, numbness,  tingling, weakness of extremities.  Denies any difficulty speaking.  He reports he did not lose control of bowel or bladder and his seizures were reported to be generalized shaking.  He denies any other complaints on arrival.  On exam, no focal neurologic deficits observed.  Normal speech.  Pupils reactive with normal extraocular movements.  Normal sensation strength  in extremities.  Finger-nose-finger testing normal.  Negative for Hoffmann sign on both hands for myelopathy test.  Lungs clear chest nontender right abdomen nontender.  No evidence of trauma seen.  Patient will have screening laboratory testing to look for significant lecture light abnormalities.  As patient had recent CT scan which showed possible Chiari malformation, will touch base with neurology as this is a second cluster of seizures.  Will discuss if needs to be started on antiepileptic medication.  We will also discussed that further imaging is needed if Chiari med contributed to the seizures or transient blurry vision yesterday.  He will be given some fluids for possible dehydration.  Anticipate reassessment after work-up.  10:37 AM Patient's diagnostic laboratory testing was overall reassuring with only mild hypocalcemia.  No kidney dysfunction, leukocytosis, or anemia.  Electrolytes otherwise reassuring.  Spoke with neurology who recommended initiation of Keppra 500 mg twice a day given these recurrent seizures.  They also recommended outpatient ambulatory referral to neurology to help facilitate appointment scheduling.  Patient was informed of this plan and then he describe new symptoms.  He says that over the last few months he has had worsening and more recurrent episodes where he has episodes of lightheadedness and feeling electric shooting sensation coming from his neck to the rest of his body.  He reports that repositioning improves it.  With this discovery and his imaging 1.5 months ago showing possible Chiari  malformation, neurology recommended MRI cervical spine and brain without contrast to evaluate.  Anticipate speaking with neurosurgery after this given her high concern for Chiari as contributing to his symptoms.  While patient was going to MRI, he started feeling anxious.  IV Ativan was ordered however when patient laid back for MRI, he reportedly had a seizure lasting several minutes.  Patient was given 2 mg of Ativan to help break the seizure.  Patient was postictal and was brought back to his exam room.  Patient was reassessed by me and is somnolent with postictal status.  Patient will be started on Keppra IV and neurology will be consulted to come see him.  Patient will still need the MRI and then neurosurgery consultation to make sure this is not related to Chiari malformation.  Anticipate following up on recommendations of neurology and neurosurgery.  Care transferred to oncoming team while awaiting for results of MRI brain and cervical spine and specialty recommendations from neurology and likely neurosurgery.   Final Clinical Impressions(s) / ED Diagnoses   Final diagnoses:  Seizure (HCC)  Tingling    Clinical Impression: 1. Seizure (HCC)   2. Tingling     Disposition: Care transferred to oncoming team while awaiting for results of MRI brain and cervical spine and specialty recommendations from neurology and likely neurosurgery.  This note was prepared with assistance of Conservation officer, historic buildings. Occasional wrong-word or sound-a-like substitutions may have occurred due to the inherent limitations of voice recognition software.      Byrdie Miyazaki, Canary Brim, MD 11/25/18 [phone]

## 2018-11-25 NOTE — Progress Notes (Signed)
MRI getting patient at this time. Per Dr Amada Jupiter EEG can wait until tomorrow.

## 2018-11-26 ENCOUNTER — Observation Stay (HOSPITAL_COMMUNITY): Payer: Self-pay

## 2018-11-26 LAB — HIV ANTIBODY (ROUTINE TESTING W REFLEX): HIV Screen 4th Generation wRfx: NONREACTIVE

## 2018-11-26 MED ORDER — LEVETIRACETAM 1000 MG PO TABS: 1000.0000 mg | ORAL_TABLET | Freq: Two times a day (BID) | ORAL | 1 refills | Status: DC

## 2018-11-26 MED FILL — levETIRAcetam 500 MG TABS: 500 | 30 days supply | Qty: 120 | Fill #0

## 2018-11-26 NOTE — Procedures (Signed)
ELECTROENCEPHALOGRAM REPORT   Patient: Todd Yoder       Room #: 2D92E EEG No. ID: [id number] Age: 33 y.o.        Sex: male Referring Physician: Elvera Lennox Report Date:  11/26/2018        Interpreting Physician: Thana Farr  History: Todd Yoder is an 33 y.o. male with new onset seizures  Medications:  Keppra  Conditions of Recording:  This is a 21 channel routine scalp EEG performed with bipolar and monopolar montages arranged in accordance to the international 10/20 system of electrode placement. One channel was dedicated to EKG recording.  The patient is in the awake, drowsy and asleep states.  Description:  The waking background activity consists of a low voltage, symmetrical, fairly well organized, 9 Hz alpha activity, seen from the parieto-occipital and posterior temporal regions.  Low voltage fast activity, poorly organized, is seen anteriorly and is at times superimposed on more posterior regions.  A mixture of theta and alpha rhythms are seen from the central and temporal regions. The patient drowses with slowing to irregular, low voltage theta and beta activity.   The patient goes in to a light sleep with symmetrical sleep spindles, vertex central sharp transients and irregular slow activity.  No epileptiform activity is noted.   Hyperventilation was performed and produced a mild to moderate buildup but failed to elicit any abnormalities.  Intermittent photic stimulation was performed but failed to illicit any change in the tracing.    IMPRESSION: Normal electroencephalogram, awake, asleep and with activation procedures. There are no focal lateralizing or epileptiform features.   Thana Farr, MD Neurology [phone] 11/26/2018, 11:25 AM

## 2018-11-26 NOTE — Progress Notes (Signed)
EEG Completed; Results Pending  

## 2018-11-26 NOTE — Progress Notes (Signed)
RN attempted to delivery pt discharge paper work. Pt not in room, unit searched, patient not found. Pt is A/O x4 and ambulatory. Pt left unit without being d/c by nurse. IV was not removed by RN, security notified. Todd Yoder

## 2018-11-26 NOTE — Progress Notes (Signed)
Pt admitted to the unit from ED; pt A&O x4; pt oriented to the unit and room; fall/safety precaution and prevention education completed and pt voices understanding. Skin dry and intact with no pressure ulcer or opened wounds noted; skin assessment completed with second RN per protocol. Telemetry applied and verified with CCMD: NT called to second verify. VSS: pt refused SCD's; pt irritable; requesting to go out to smoke; report right shoulder pain which he said has been there since he had the seizure; pt wanted to leave AMA but RN educated pt and he has agreed to stay. Pt request for pain medication and something to calm him down as he voice stress; Pt keeps on jumping out of bed and requested for bed alarm to be off; bed alarm off; pt in bed with call light within reach. NP on-call notified and new order received. Dionne Bucy RN   11/25/18 2136  Vitals  Temp 97.6 F (36.4 C)  Temp Source Oral  BP (!) 148/83  MAP (mmHg) 101  BP Method Automatic  Pulse Rate 82  Pulse Rate Source Monitor  Resp 16  Oxygen Therapy  SpO2 100 %  Height and Weight  Height 6\' 5"  (1.956 m)  Weight 101.8 kg  Type of Scale Used Bed  Type of Weight Actual  BSA (Calculated - sq m) 2.35 sq meters  BMI (Calculated) 26.61  Weight in (lb) to have BMI = 25 210.4  MEWS Score  MEWS RR 0  MEWS Pulse 0  MEWS Systolic 0  MEWS LOC 0  MEWS Temp 0  MEWS Score 0  MEWS Score Color [place] Si

## 2018-11-26 NOTE — Progress Notes (Signed)
Called by hospitalist.  Patient eager to leave and not willing to wait for further testing. EEG pending at this time EEG can be done outpatient. 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  No myelopathy.  No syringomyelia.  Recommendation Continue antiepileptics Maintain seizure precautions including driving restrictions per [facility] law Outpatient neurosurgical consultation for the Chiari  If the patient continues to be in the hospital, I will round on him, otherwise he can follow-up with outpatient neurology.  -- Milon Dikes, MD [facility] Pager: [phone] If 7pm to 7am, please call on call as listed on AMION.

## 2018-11-26 NOTE — Progress Notes (Addendum)
Subjective: Patient feeling back to baseline; he denies   Objective: Current vital signs: BP (!) 148/96 (BP Location: Right Arm)   Pulse 65   Temp 98.7 F (37.1 C) (Oral)   Resp 18   Ht  (1.956 m)   Wt 101.8 kg   SpO2 100%   BMI 26.61 kg/m  Vital signs in last 24 hours: Temp:  [97.6 F (36.4 C)-98.7 F (37.1 C)] 98.7 F (37.1 C) (05/18 0738) Pulse Rate:  [56-82] 65 (05/18 0738) Resp:  [12-24] 18 (05/18 0738) BP: (105-148)/(63-96) 148/96 (05/18 0738) SpO2:  [97 %-100 %] 100 % (05/18 0738) Weight:  [101.8 kg] 101.8 kg (05/17 2136)  Intake/Output from previous day: 05/17 0701 - 05/18 0700 In: 700 [IV Piggyback:700] Out: -  Intake/Output this shift: No intake/output data recorded. Nutritional status:  Diet Order            Diet regular Room service appropriate? Yes; Fluid consistency: Thin  Diet effective now              Neurologic Exam: General: Awake alert in no distress HEENT: Normocephalic atraumatic CVS: Respiratory regular rate rhythm Chest: Clear to auscultation Extremities: Warm well perfused Neurological exam Awake alert oriented x3 Speech is clear No evidence of aphasia Cranial nerves: Pupils equal round react light, extraocular movements intact, visual fields full, face symmetric, facial sensation intact, palate midline, tongue midline, shoulder shrug intact. Motor exam: 5/5 in all 4 extremities Sensory exam: Intact to light touch all over Gait was normal   Lab Results: Results for orders placed or performed during the hospital encounter of 11/25/18 (from the past 48 hour(s))  CBC with Differential     Status: None   Collection Time: 11/25/18  7:51 AM  Result Value Ref Range   WBC 8.3 4.0 - 10.5 K/uL   RBC 4.85 4.22 - 5.81 MIL/uL   Hemoglobin 14.3 13.0 - 17.0 g/dL   HCT 96.2 95.2 - 84.1 %   MCV 89.1 80.0 - 100.0 fL   MCH 29.5 26.0 - 34.0 pg   MCHC 33.1 30.0 - 36.0 g/dL   RDW 32.4 40.1 - 02.7 %   Platelets 173 150 - 400 K/uL   nRBC  0.0 0.0 - 0.2 %   Neutrophils Relative % 83 %   Neutro Abs 6.9 1.7 - 7.7 K/uL   Lymphocytes Relative 10 %   Lymphs Abs 0.9 0.7 - 4.0 K/uL   Monocytes Relative 6 %   Monocytes Absolute 0.5 0.1 - 1.0 K/uL   Eosinophils Relative 1 %   Eosinophils Absolute 0.0 0.0 - 0.5 K/uL   Basophils Relative 0 %   Basophils Absolute 0.0 0.0 - 0.1 K/uL   Immature Granulocytes 0 %   Abs Immature Granulocytes 0.02 0.00 - 0.07 K/uL    Comment: Performed at [facility], [address] [place], [place] [address]  Comprehensive metabolic panel     Status: Abnormal   Collection Time: 11/25/18  7:51 AM  Result Value Ref Range   Sodium 136 135 - 145 mmol/L   Potassium 4.7 3.5 - 5.1 mmol/L   Chloride 110 98 - 111 mmol/L   CO2 20 (L) 22 - 32 mmol/L   Glucose, Bld 83 70 - 99 mg/dL   BUN 25 (H) 6 - 20 mg/dL   Creatinine, Ser 4.40 0.61 - 1.24 mg/dL   Calcium 8.7 (L) 8.9 - 10.3 mg/dL   Total Protein 6.0 (L) 6.5 - 8.1 g/dL   Albumin  3.7 3.5 - 5.0 g/dL   AST 27 15 - 41 U/L   ALT 17 0 - 44 U/L   Alkaline Phosphatase 45 38 - 126 U/L   Total Bilirubin 0.8 0.3 - 1.2 mg/dL   GFR calc non Af Amer >60 >60 mL/min   GFR calc Af Amer >60 >60 mL/min   Anion gap 6 5 - 15    Comment: Performed at [facility], [address] [place], [place] [address]  SARS Coronavirus 2 (CEPHEID - Performed in [facility] hospital lab), Hosp Order     Status: None   Collection Time: 11/25/18  6:34 PM  Result Value Ref Range   SARS Coronavirus 2 NEGATIVE NEGATIVE    Comment: (NOTE) If result is NEGATIVE SARS-CoV-2 target nucleic acids are NOT DETECTED. The SARS-CoV-2 RNA is generally detectable in upper and lower  respiratory specimens during the acute phase of infection. The lowest  concentration of SARS-CoV-2 viral copies this assay can detect is 250  copies / mL. A negative result does not preclude SARS-CoV-2 infection  and should not be used as the sole basis for treatment or other  patient management decisions.  A  negative result may occur with  improper specimen collection / handling, submission of specimen other  than nasopharyngeal swab, presence of viral mutation(s) within the  areas targeted by this assay, and inadequate number of viral copies  (<250 copies / mL). A negative result must be combined with clinical  observations, patient history, and epidemiological information. If result is POSITIVE SARS-CoV-2 target nucleic acids are DETECTED. The SARS-CoV-2 RNA is generally detectable in upper and lower  respiratory specimens dur ing the acute phase of infection.  Positive  results are indicative of active infection with SARS-CoV-2.  Clinical  correlation with patient history and other diagnostic information is  necessary to determine patient infection status.  Positive results do  not rule out bacterial infection or co-infection with other viruses. If result is PRESUMPTIVE POSTIVE SARS-CoV-2 nucleic acids MAY BE PRESENT.   A presumptive positive result was obtained on the submitted specimen  and confirmed on repeat testing.  While 2019 novel coronavirus  (SARS-CoV-2) nucleic acids may be present in the submitted sample  additional confirmatory testing may be necessary for epidemiological  and / or clinical management purposes  to differentiate between  SARS-CoV-2 and other Sarbecovirus currently known to infect humans.  If clinically indicated additional testing with an alternate test  methodology [phone]([id number]) is advised. The SARS-CoV-2 RNA is generally  detectable in upper and lower respiratory sp ecimens during the acute  phase of infection. The expected result is Negative. Fact Sheet for Patients:  BoilerBrush.com.cyhttps://www.fda.gov/media/136312/download Fact Sheet for Healthcare Providers: https://pope.com/https://www.fda.gov/media/136313/download This test is not yet approved or cleared by the [place] FDA and has been authorized for detection and/or diagnosis of SARS-CoV-2 by FDA under an Emergency Use  Authorization (EUA).  This EUA will remain in effect (meaning this test can be used) for the duration of the COVID-19 declaration under Section 564(b)(1) of the Act, 21 U.S.C. section 360bbb-3(b)(1), unless the authorization is terminated or revoked sooner. Performed at [facility], [address] [place], [place] [address]     Recent Results (from the past 240 hour(s))  SARS Coronavirus 2 (CEPHEID - Performed in [facility] hospital lab), Hosp Order     Status: None   Collection Time: 11/25/18  6:34 PM  Result Value Ref Range Status   SARS Coronavirus 2 NEGATIVE NEGATIVE  Final    Comment: (NOTE) If result is NEGATIVE SARS-CoV-2 target nucleic acids are NOT DETECTED. The SARS-CoV-2 RNA is generally detectable in upper and lower  respiratory specimens during the acute phase of infection. The lowest  concentration of SARS-CoV-2 viral copies this assay can detect is 250  copies / mL. A negative result does not preclude SARS-CoV-2 infection  and should not be used as the sole basis for treatment or other  patient management decisions.  A negative result may occur with  improper specimen collection / handling, submission of specimen other  than nasopharyngeal swab, presence of viral mutation(s) within the  areas targeted by this assay, and inadequate number of viral copies  (<250 copies / mL). A negative result must be combined with clinical  observations, patient history, and epidemiological information. If result is POSITIVE SARS-CoV-2 target nucleic acids are DETECTED. The SARS-CoV-2 RNA is generally detectable in upper and lower  respiratory specimens dur ing the acute phase of infection.  Positive  results are indicative of active infection with SARS-CoV-2.  Clinical  correlation with patient history and other diagnostic information is  necessary to determine patient infection status.  Positive results do  not rule out bacterial infection or co-infection with other  viruses. If result is PRESUMPTIVE POSTIVE SARS-CoV-2 nucleic acids MAY BE PRESENT.   A presumptive positive result was obtained on the submitted specimen  and confirmed on repeat testing.  While 2019 novel coronavirus  (SARS-CoV-2) nucleic acids may be present in the submitted sample  additional confirmatory testing may be necessary for epidemiological  and / or clinical management purposes  to differentiate between  SARS-CoV-2 and other Sarbecovirus currently known to infect humans.  If clinically indicated additional testing with an alternate test  methodology [phone]) is advised. The SARS-CoV-2 RNA is generally  detectable in upper and lower respiratory sp ecimens during the acute  phase of infection. The expected result is Negative. Fact Sheet for Patients:  BoilerBrush.com.cy Fact Sheet for Healthcare Providers: https://pope.com/ This test is not yet approved or cleared by the [place] FDA and has been authorized for detection and/or diagnosis of SARS-CoV-2 by FDA under an Emergency Use Authorization (EUA).  This EUA will remain in effect (meaning this test can be used) for the duration of the COVID-19 declaration under Section 564(b)(1) of the Act, 21 U.S.C. section 360bbb-3(b)(1), unless the authorization is terminated or revoked sooner. Performed at [facility], [address] [place], [place] [address]     Studies/Results: Mr Brain [place] Contrast  Result Date: 11/25/2018 CLINICAL DATA:  New seizure.  Paresthesia. EXAM: MRI HEAD WITHOUT CONTRAST MRI CERVICAL SPINE WITHOUT CONTRAST TECHNIQUE: Multiplanar, multiecho pulse sequences of the brain and surrounding structures, and cervical spine, to include the craniocervical junction and cervicothoracic junction, were obtained without intravenous contrast. COMPARISON:  CT head 10/04/2018 FINDINGS: MRI HEAD FINDINGS Brain: Ventricle size normal. Negative for acute or chronic  infarction. Negative for demyelinating disease. Negative for hemorrhage or mass Chiari malformation. Cerebellar tonsils extend 11 mm below the foramen magnum and show mild impaction. Vascular: Normal arterial flow voids Skull and upper cervical spine: Negative Sinuses/Orbits: Negative Other: None MRI CERVICAL SPINE FINDINGS Alignment: Normal Vertebrae: Normal bone marrow.  Negative for fracture or mass Cord: Normal cord signal.  No cord compression. Posterior Fossa, vertebral arteries, paraspinal tissues: Chiari malformation as above. No mass in the neck Disc levels: Negative for disc degeneration. Negative for disc protrusion or stenosis. IMPRESSION: 1. No acute intracranial abnormality 2. Chiari  malformation with cerebellar tonsils extending 11 mm below the foramen magnum. Negative for hydrocephalus 3. Negative MRI cervical spine.  Negative for syrinx Electronically Signed   By: Marlan Palau M.D.   On: 11/25/2018 18:26   Mr Cervical Spine Wo Contrast  Result Date: 11/25/2018 CLINICAL DATA:  New seizure.  Paresthesia. EXAM: MRI HEAD WITHOUT CONTRAST MRI CERVICAL SPINE WITHOUT CONTRAST TECHNIQUE: Multiplanar, multiecho pulse sequences of the brain and surrounding structures, and cervical spine, to include the craniocervical junction and cervicothoracic junction, were obtained without intravenous contrast. COMPARISON:  CT head 10/04/2018 FINDINGS: MRI HEAD FINDINGS Brain: Ventricle size normal. Negative for acute or chronic infarction. Negative for demyelinating disease. Negative for hemorrhage or mass Chiari malformation. Cerebellar tonsils extend 11 mm below the foramen magnum and show mild impaction. Vascular: Normal arterial flow voids Skull and upper cervical spine: Negative Sinuses/Orbits: Negative Other: None MRI CERVICAL SPINE FINDINGS Alignment: Normal Vertebrae: Normal bone marrow.  Negative for fracture or mass Cord: Normal cord signal.  No cord compression. Posterior Fossa, vertebral arteries,  paraspinal tissues: Chiari malformation as above. No mass in the neck Disc levels: Negative for disc degeneration. Negative for disc protrusion or stenosis. IMPRESSION: 1. No acute intracranial abnormality 2. 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 Negative for hydrocephalus 3. Negative MRI cervical spine.  Negative for syrinx Electronically Signed   By: Marlan Palau M.D.   On: 11/25/2018 18:26   I have personally reviewed the MRI.  No acute changes. Cerebellar tonsils 11 mm below the foramen magnum. No evidence of intrinsic cord abnormality on MRI C-spine.  Assessment/Plan:  33 year old man with past medical history of first-time seizure 1 month ago and another 2 seizures on prior to this admission along with a third witnessed seizure while in the MRI scanner lasting 1 to 2 minutes with foaming at the mouth and tongue bite followed by postictal phase.  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  Imaging reassuring EEG pending  Recommendations - Continue Keppra - EEG pending-will update after results are available. -Discussed seizure precautions in detail-see below.  Per [facility] statutes, patients with seizures are not allowed to drive until they have been seizure-free for six months.   Use caution when using heavy equipment or power tools. Avoid working on ladders or at heights. Take showers instead of baths. Ensure the water temperature is not too high on the home water heater. Do not go swimming alone. Do not lock yourself in a room alone (i.e. bathroom). When caring for infants or small children, sit down when holding, feeding, or changing them to minimize risk of injury to the child in the event you have a seizure. Maintain good sleep hygiene. Avoid alcohol.   If patient has another seizure, call 911 and bring them back to the ED if: A. The seizure lasts longer than 5 minutes.  B. The patient doesn't  wake shortly after the seizure or has new problems such as difficulty seeing, speaking or moving following the seizure C. The patient was injured during the seizure D. The patient has a temperature over 102 F (39C) E. The patient vomited during the seizure and now is having trouble breathing    LOS: 0 days   Nyra Market, MD PGY3 11/26/2018  8:59 AM   -- Milon Dikes, MD [facility] Pager: [phone] If 7pm to 7am, please call on call as listed on AMION.   ADDENDUM EEG done-normal. Recommendations as above. -- Milon Dikes, MD  [facility] Pager: [phone] If 7pm to 7am, please call on call as listed on AMION.

## 2018-11-26 NOTE — Discharge Instructions (Signed)
Follow with Patient, No Pcp Per in 5-7 days  Please get a complete blood count and chemistry panel checked by your Primary MD at your next visit, and again as instructed by your Primary MD. Please get your medications reviewed and adjusted by your Primary MD.  Please request your Primary MD to go over all Hospital Tests and Procedure/Radiological results at the follow up, please get all Hospital records sent to your Prim MD by signing hospital release before you go home.  In some cases, there will be blood work, cultures and biopsy results pending at the time of your discharge. Please request that your primary care M.D. goes through all the records of your hospital data and follows up on these results.  If you had Pneumonia of Lung problems at the Hospital: Please get a 2 view Chest X ray done in 6-8 weeks after hospital discharge or sooner if instructed by your Primary MD.  If you have Congestive Heart Failure: Please call your Cardiologist or Primary MD anytime you have any of the following symptoms:  1) 3 pound weight gain in 24 hours or 5 pounds in 1 week  2) shortness of breath, with or without a dry hacking cough  3) swelling in the hands, feet or stomach  4) if you have to sleep on extra pillows at night in order to breathe  Follow cardiac low salt diet and 1.5 lit/day fluid restriction.  If you have diabetes Accuchecks 4 times/day, Once in AM empty stomach and then before each meal. Log in all results and show them to your primary doctor at your next visit. If any glucose reading is under 80 or above 300 call your primary MD immediately.  If you have Seizure/Convulsions/Epilepsy: Please do not drive, operate heavy machinery, participate in activities at heights or participate in high speed sports until you have seen by Primary MD or a Neurologist and advised to do so again. Per [facility] statutes, patients with seizures are not allowed to drive until they have been  seizure-free for six months.  Use caution when using heavy equipment or power tools. Avoid working on ladders or at heights. Take showers instead of baths. Ensure the water temperature is not too high on the home water heater. Do not go swimming alone. Do not lock yourself in a room alone (i.e. bathroom). When caring for infants or small children, sit down when holding, feeding, or changing them to minimize risk of injury to the child in the event you have a seizure. Maintain good sleep hygiene. Avoid alcohol.   If you had Gastrointestinal Bleeding: Please ask your Primary MD to check a complete blood count within one week of discharge or at your next visit. Your endoscopic/colonoscopic biopsies that are pending at the time of discharge, will also need to followed by your Primary MD.  Get Medicines reviewed and adjusted. Please take all your medications with you for your next visit with your Primary MD  Please request your Primary MD to go over all hospital tests and procedure/radiological results at the follow up, please ask your Primary MD to get all Hospital records sent to his/her office.  If you experience worsening of your admission symptoms, develop shortness of breath, life threatening emergency, suicidal or homicidal thoughts you must seek medical attention immediately by calling 911 or calling your MD immediately  if symptoms less severe.  You must read complete instructions/literature along with all the possible adverse reactions/side effects for all the Medicines you  take and that have been prescribed to you. Take any new Medicines after you have completely understood and accpet all the possible adverse reactions/side effects.   Do not drive or operate heavy machinery when taking Pain medications.   Do not take more than prescribed Pain, Sleep and Anxiety Medications  Special Instructions: If you have smoked or chewed Tobacco  in the last 2 yrs please stop smoking, stop any regular  Alcohol  and or any Recreational drug use.  Wear Seat belts while driving.  Please note You were cared for by a hospitalist during your hospital stay. If you have any questions about your discharge medications or the care you received while you were in the hospital after you are discharged, you can call the unit and asked to speak with the hospitalist on call if the hospitalist that took care of you is not available. Once you are discharged, your primary care physician will handle any further medical issues. Please note that NO REFILLS for any discharge medications will be authorized once you are discharged, as it is imperative that you return to your primary care physician (or establish a relationship with a primary care physician if you do not have one) for your aftercare needs so that they can reassess your need for medications and monitor your lab values.  You can reach the hospitalist office at phone [phone] or fax [phone]   If you do not have a primary care physician, you can call [phone] for a physician referral.  Activity: As tolerated with Full fall precautions use walker/cane & assistance as needed    Diet: regular  Disposition Home

## 2018-11-26 NOTE — Discharge Summary (Signed)
Physician Discharge Summary  Todd Yoder NKN:[id number] DOB: 12/20/1985 DOA: 11/25/2018  PCP: Patient, No Pcp Per  Admit date: 11/25/2018 Discharge date: 11/26/2018  Admitted From: home Disposition:  home  Recommendations for Outpatient Follow-up:  1. Follow up with PCP in 1-2 weeks 2. Follow up with Neurology and Neurosurgery 2-4 weeks  Home Health: none Equipment/Devices: none  Discharge Condition: stable CODE STATUS: Full code Diet recommendation: regular   HPI: Per admitting MD, Todd Yoder is a 33 y.o. male with medical history significant of bipolar disorder not appearing to be on any medications, otherwise relatively healthy who presents to the hospital with complaints of seizure.  Patient was seen in the emergency room in March 2020 with a one-time seizure episode, negative work-up at that time and he was discharged home from the ED.  Patient was brought today with 2 generalized seizures at home witnessed by family, apparently first 1 while patient was asleep, and then a third witnessed convulsive seizure while getting an MRI that lasted about couple of minutes, per reports patient was foaming at the mouth and bit his tongue at that time.  He was given IV Ativan and Keppra, neurology was consulted and we are asked to admit. On my evaluation patient is alert and oriented x4, has no recollection of his seizure episodes.  He tells me he had one episode of seizure couple months ago but nothing since and nothing prior to that.  He reports family history of seizures in his mother, she developed them when she was in her 70s.  She states that she has had some lightheadedness the day prior, however he was working on moving and doing extraneous activity.  She smokes cigarettes and occasional marijuana.  She drinks occasionally, had some liquor last night with his father but prior to that alcohol consumption was several weeks back. ED Course: In the emergency room he is afebrile,  normotensive, satting well on room air.  Blood work is essentially unremarkable.  Urine drug screen positive for tetrahydrocannabinol. He underwent a CT scan of the brain with borderline findings for Chiari I malformation but otherwise unremarkable.  Hospital Course: Seizures -The patient with an initial seizure couple of months ago with a negative work-up at that time and placed on no AEDs.  He comes back with back-to-back generalized seizures x3, 2 at home as well as 1 in the emergency room.  Neurology consulted and followed patient while hospitalized.  He was placed on Keppra without further seizures.  He underwent an MRI of the brain which showed a Chiari I dimension but otherwise was unremarkable.  He underwent an EEG which did not show any evidence of further epileptic activity.  He remained stable, I have discussed with neurology, will place on Keppra and discharged home in stable condition.  Patient was advised of no driving for 6 months, he will need to follow-up with neurology as well as neurosurgery as an outpatient.  Information regarding offices was provided to the patient.  Active Problems Marijuana use -counseled for cessation  Discharge Diagnoses:  Active Problems:   Seizures [facility])     Discharge Instructions  Discharge Instructions    Ambulatory referral to Neurology   Complete by:  As directed    An appointment is requested in approximately: 2 weeks     Allergies as of 11/26/2018      Reactions   Tramadol Nausea And Vomiting   Augmentin [amoxicillin-pot Clavulanate] Rash, Other (See Comments)   Has patient had  a PCN reaction causing immediate rash, facial/tongue/throat swelling, SOB or lightheadedness with hypotension: No Has patient had a PCN reaction causing severe rash involving mucus membranes or skin necrosis: Yes Has patient had a PCN reaction that required hospitalization Yes Has patient had a PCN reaction occurring within the last 10 years: No If all of the  above answers are "NO", then may proceed with Cephalosporin use.   Nickel Rash   Burning   Silver Rash   Burning      Medication List    STOP taking these medications   BACLOFEN PO   docusate sodium 100 MG capsule Commonly known as:  Colace   doxycycline 100 MG capsule Commonly known as:  VIBRAMYCIN   HYDROcodone-acetaminophen 5-325 MG tablet Commonly known as:  Norco   methocarbamol 500 MG tablet Commonly known as:  Robaxin   ondansetron 4 MG tablet Commonly known as:  Zofran     TAKE these medications   Biotin 1610910000 MCG Tabs Take 10,000 mcg by mouth daily.   levETIRAcetam 1000 MG tablet Commonly known as:  Keppra Take 1 tablet (1,000 mg total) by mouth 2 (two) times daily.   Mucinex 600 MG 12 hr tablet Generic drug:  guaiFENesin Take 600 mg by mouth 2 (two) times daily as needed (Congestion).   multivitamin with minerals Tabs tablet Take 1 tablet by mouth daily.   naproxen sodium 220 MG tablet Commonly known as:  ALEVE Take 440 mg by mouth daily as needed (pain).   NYQUIL PO Take 1 Dose by mouth daily as needed (Itchy Throat).   VITAMIN C PO Take 3 tablets by mouth daily.      Follow-up Information    [facility]. Schedule an appointment as soon as possible for a visit in 3 week(s).   Contact information: [address], [address] [place] [id number] [phone]       [place]. Schedule an appointment as soon as possible for a visit in 4 week(s).   Specialty:  Neurosurgery Contact information: [address] [place] [address] [phone]           Consultations:  Neurology  Procedures/Studies:  Mr Brain Wo Contrast  Result Date: 11/25/2018 CLINICAL DATA:  New seizure.  Paresthesia. EXAM: MRI HEAD WITHOUT CONTRAST MRI CERVICAL SPINE WITHOUT CONTRAST TECHNIQUE: Multiplanar, multiecho pulse sequences of the brain and surrounding structures, and cervical  spine, to include the craniocervical junction and cervicothoracic junction, were obtained without intravenous contrast. COMPARISON:  CT head 10/04/2018 FINDINGS: MRI HEAD FINDINGS Brain: Ventricle size normal. Negative for acute or chronic infarction. Negative for demyelinating disease. Negative for hemorrhage or mass Chiari malformation. Cerebellar tonsils extend 11 mm below the foramen magnum and show mild impaction. Vascular: Normal arterial flow voids Skull and upper cervical spine: Negative Sinuses/Orbits: Negative Other: None MRI CERVICAL SPINE FINDINGS Alignment: Normal Vertebrae: Normal bone marrow.  Negative for fracture or mass Cord: Normal cord signal.  No cord compression. Posterior Fossa, vertebral arteries, paraspinal tissues: Chiari malformation as above. No mass in the neck Disc levels: Negative for disc degeneration. Negative for disc protrusion or stenosis. IMPRESSION: 1. No acute intracranial abnormality 2. 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 Negative for hydrocephalus 3. Negative MRI cervical spine.  Negative for syrinx Electronically Signed   By: Marlan Palauharles  Clark M.D.   On: 11/25/2018 18:26   Mr Cervical Spine Wo Contrast  Result Date: 11/25/2018 CLINICAL DATA:  New seizure.  Paresthesia. EXAM: MRI HEAD WITHOUT CONTRAST MRI CERVICAL SPINE WITHOUT CONTRAST TECHNIQUE: Multiplanar, multiecho pulse sequences of the brain and surrounding structures, and cervical spine, to include the craniocervical junction and cervicothoracic junction, were obtained without intravenous contrast. COMPARISON:  CT head 10/04/2018 FINDINGS: MRI HEAD FINDINGS Brain: Ventricle size normal. Negative for acute or chronic infarction. Negative for demyelinating disease. Negative for hemorrhage or mass Chiari malformation. Cerebellar tonsils extend 11 mm below the foramen magnum and show mild impaction. Vascular: Normal arterial flow voids Skull and upper cervical spine:  Negative Sinuses/Orbits: Negative Other: None MRI CERVICAL SPINE FINDINGS Alignment: Normal Vertebrae: Normal bone marrow.  Negative for fracture or mass Cord: Normal cord signal.  No cord compression. Posterior Fossa, vertebral arteries, paraspinal tissues: Chiari malformation as above. No mass in the neck Disc levels: Negative for disc degeneration. Negative for disc protrusion or stenosis. IMPRESSION: 1. No acute intracranial abnormality 2. 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 Negative for hydrocephalus 3. Negative MRI cervical spine.  Negative for syrinx Electronically Signed   By: Marlan Palau M.D.   On: 11/25/2018 18:26      Subjective: - no chest pain, shortness of breath, no abdominal pain, nausea or vomiting.   Discharge Exam: BP (!) 139/103 (BP Location: Right Arm)    Pulse 82    Temp 98 F (36.7 C)    Resp 18    Ht  (1.956 m)    Wt 101.8 kg    SpO2 100%    BMI 26.61 kg/m   General: Pt is alert, awake, not in acute distress Cardiovascular: RRR, S1/S2 +, no rubs, no gallops Respiratory: CTA bilaterally, no wheezing, no rhonchi Abdominal: Soft, NT, ND, bowel sounds + Extremities: no edema, no cyanosis    The results of significant diagnostics from this hospitalization (including imaging, microbiology, ancillary and laboratory) are listed below for reference.     Microbiology: Recent Results (from the past 240 hour(s))  SARS Coronavirus 2 (CEPHEID - Performed in [facility] hospital lab), Hosp Order     Status: None   Collection Time: 11/25/18  6:34 PM  Result Value Ref Range Status   SARS Coronavirus 2 NEGATIVE NEGATIVE Final    Comment: (NOTE) If result is NEGATIVE SARS-CoV-2 target nucleic acids are NOT DETECTED. The SARS-CoV-2 RNA is generally detectable in upper and lower  respiratory specimens during the acute phase of infection. The lowest  concentration of SARS-CoV-2 viral copies this assay can detect is 250    copies / mL. A negative result does not preclude SARS-CoV-2 infection  and should not be used as the sole basis for treatment or other  patient management decisions.  A negative result may occur with  improper specimen collection / handling, submission of specimen other  than nasopharyngeal swab, presence of viral mutation(s) within the  areas targeted by this assay, and inadequate number of viral copies  (<250 copies / mL). A negative result must be combined with clinical  observations, patient history, and epidemiological information. If result is POSITIVE SARS-CoV-2 target nucleic acids are DETECTED. The SARS-CoV-2 RNA is generally detectable in upper and lower  respiratory specimens dur ing the acute phase of infection.  Positive  results are indicative of active infection with SARS-CoV-2.  Clinical  correlation with patient history and other diagnostic information is  necessary to determine patient infection status.  Positive results do  not rule out bacterial infection or co-infection with other viruses. If result  is PRESUMPTIVE POSTIVE SARS-CoV-2 nucleic acids MAY BE PRESENT.   A presumptive positive result was obtained on the submitted specimen  and confirmed on repeat testing.  While 2019 novel coronavirus  (SARS-CoV-2) nucleic acids may be present in the submitted sample  additional confirmatory testing may be necessary for epidemiological  and / or clinical management purposes  to differentiate between  SARS-CoV-2 and other Sarbecovirus currently known to infect humans.  If clinically indicated additional testing with an alternate test  methodology [phone]) is advised. The SARS-CoV-2 RNA is generally  detectable in upper and lower respiratory sp ecimens during the acute  phase of infection. The expected result is Negative. Fact Sheet for Patients:  BoilerBrush.com.cy Fact Sheet for Healthcare  Providers: https://pope.com/ This test is not yet approved or cleared by the [place] FDA and has been authorized for detection and/or diagnosis of SARS-CoV-2 by FDA under an Emergency Use Authorization (EUA).  This EUA will remain in effect (meaning this test can be used) for the duration of the COVID-19 declaration under Section 564(b)(1) of the Act, 21 U.S.C. section 360bbb-3(b)(1), unless the authorization is terminated or revoked sooner. Performed at [facility], [address], [place], [place] [address]      Labs: BNP (last 3 results) No results for input(s): BNP in the last 8760 hours. Basic Metabolic Panel: Recent Labs  Lab 11/25/18 0751  NA 136  K 4.7  CL 110  CO2 20*  GLUCOSE 83  BUN 25*  CREATININE 1.10  CALCIUM 8.7*   Liver Function Tests: Recent Labs  Lab 11/25/18 0751  AST 27  ALT 17  ALKPHOS 45  BILITOT 0.8  PROT 6.0*  ALBUMIN 3.7   No results for input(s): LIPASE, AMYLASE in the last 168 hours. No results for input(s): AMMONIA in the last 168 hours. CBC: Recent Labs  Lab 11/25/18 0751  WBC 8.3  NEUTROABS 6.9  HGB 14.3  HCT 43.2  MCV 89.1  PLT 173   Cardiac Enzymes: No results for input(s): CKTOTAL, CKMB, CKMBINDEX, TROPONINI in the last 168 hours. BNP: Invalid input(s): POCBNP CBG: No results for input(s): GLUCAP in the last 168 hours. D-Dimer No results for input(s): DDIMER in the last 72 hours. Hgb A1c No results for input(s): HGBA1C in the last 72 hours. Lipid Profile No results for input(s): CHOL, HDL, LDLCALC, TRIG, CHOLHDL, LDLDIRECT in the last 72 hours. Thyroid function studies No results for input(s): TSH, T4TOTAL, T3FREE, THYROIDAB in the last 72 hours.  Invalid input(s): FREET3 Anemia work up No results for input(s): VITAMINB12, FOLATE, FERRITIN, TIBC, IRON, RETICCTPCT in the last 72 hours. Urinalysis    Component Value Date/Time   COLORURINE YELLOW 09/20/2015 1130    APPEARANCEUR CLEAR 09/20/2015 1130   LABSPEC 1.038 (H) 09/20/2015 1130   PHURINE 6.5 09/20/2015 1130   GLUCOSEU NEGATIVE 09/20/2015 1130   HGBUR NEGATIVE 09/20/2015 1130   BILIRUBINUR NEGATIVE 09/20/2015 1130   KETONESUR 15 (A) 09/20/2015 1130   PROTEINUR 100 (A) 09/20/2015 1130   NITRITE NEGATIVE 09/20/2015 1130   LEUKOCYTESUR NEGATIVE 09/20/2015 1130   Sepsis Labs Invalid input(s): PROCALCITONIN,  WBC,  LACTICIDVEN  FURTHER DISCHARGE INSTRUCTIONS:   Get Medicines reviewed and adjusted: Please take all your medications with you for your next visit with your Primary MD   Laboratory/radiological data: Please request your Primary MD to go over all hospital tests and procedure/radiological results at the follow up, please ask your Primary MD to get all Hospital records sent to his/her office.  In some cases, they will be blood work, cultures and biopsy results pending at the time of your discharge. Please request that your primary care M.D. goes through all the records of your hospital data and follows up on these results.   Also Note the following: If you experience worsening of your admission symptoms, develop shortness of breath, life threatening emergency, suicidal or homicidal thoughts you must seek medical attention immediately by calling 911 or calling your MD immediately  if symptoms less severe.   You must read complete instructions/literature along with all the possible adverse reactions/side effects for all the Medicines you take and that have been prescribed to you. Take any new Medicines after you have completely understood and accpet all the possible adverse reactions/side effects.    Do not drive when taking Pain medications or sleeping medications (Benzodaizepines)   Do not take more than prescribed Pain, Sleep and Anxiety Medications. It is not advisable to combine anxiety,sleep and pain medications without talking with your primary care practitioner   Special  Instructions: If you have smoked or chewed Tobacco  in the last 2 yrs please stop smoking, stop any regular Alcohol  and or any Recreational drug use.   Wear Seat belts while driving.   Please note: You were cared for by a hospitalist during your hospital stay. Once you are discharged, your primary care physician will handle any further medical issues. Please note that NO REFILLS for any discharge medications will be authorized once you are discharged, as it is imperative that you return to your primary care physician (or establish a relationship with a primary care physician if you do not have one) for your post hospital discharge needs so that they can reassess your need for medications and monitor your lab values.  Time coordinating discharge: 40 minutes  SIGNED:  Pamella Pert, MD, PhD 11/26/2018, 1:58 PM

## 2018-12-13 ENCOUNTER — Encounter (HOSPITAL_COMMUNITY): Payer: Self-pay | Admitting: *Deleted

## 2018-12-13 ENCOUNTER — Emergency Department (HOSPITAL_COMMUNITY)
Admission: EM | Admit: 2018-12-13 | Discharge: 2018-12-13 | Disposition: A | Discharge status: Home / Self Care | Payer: Self-pay | Attending: Emergency Medicine | Admitting: Emergency Medicine

## 2018-12-13 ENCOUNTER — Other Ambulatory Visit: Payer: Self-pay

## 2018-12-13 DIAGNOSIS — F17210 Nicotine dependence, cigarettes, uncomplicated: Secondary | ICD-10-CM | POA: Insufficient documentation

## 2018-12-13 DIAGNOSIS — Z79899 Other long term (current) drug therapy: Secondary | ICD-10-CM | POA: Insufficient documentation

## 2018-12-13 DIAGNOSIS — M6740 Ganglion, unspecified site: Secondary | ICD-10-CM | POA: Insufficient documentation

## 2018-12-13 DIAGNOSIS — R319 Hematuria, unspecified: Secondary | ICD-10-CM | POA: Insufficient documentation

## 2018-12-13 NOTE — Discharge Instructions (Addendum)
Please read attached information. If you experience any new or worsening signs or symptoms please return to the emergency room for evaluation. Please follow-up with your primary care provider or specialist as discussed.  °

## 2018-12-13 NOTE — ED Notes (Signed)
Patient verbalizes understanding of discharge instructions. Opportunity for questioning and answers were provided. Armband removed by staff, pt discharged from ED.  

## 2018-12-13 NOTE — ED Triage Notes (Signed)
Pt reports noticing a "knot" on his L posterior lateral foot since last night.  It has not changed in size.  He reports mild pain.

## 2018-12-13 NOTE — ED Provider Notes (Signed)
[facility] EMERGENCY DEPARTMENT Provider Note   CSN: [id number] Arrival date & time: 12/13/18  1542    History   Chief Complaint Chief Complaint  Patient presents with  . Foot Pain    HPI Todd Yoder is a 33 y.o. male.     HPI   33 year old male presents today with complaints of bump on his foot.  Notes yesterday he noted a small firm nodule on his left lateral foot.  He notes it is minimally tender, has not changed in size since last night.  No concerning redness, no fever, no trauma.   Past Medical History:  Diagnosis Date  . Bipolar disorder (HCC)   . Family history of adverse reaction to anesthesia    problem with aunt waking up after surgery  . GERD (gastroesophageal reflux disease)     Patient Active Problem List   Diagnosis Date Noted  . Seizures (HCC) 11/25/2018    Past Surgical History:  Procedure Laterality Date  . KNEE ARTHROSCOPY WITH MENISCAL REPAIR Right 09/19/2017   Procedure: KNEE ARTHROSCOPY WITH MENISCAL REPAIR;  Surgeon: Sheral Apley, MD;  Location: [facility];  Service: Orthopedics;  Laterality: Right;  . TOOTH EXTRACTION          Home Medications    Prior to Admission medications   Medication Sig Start Date End Date Taking? Authorizing Provider  Ascorbic Acid (VITAMIN C PO) Take 3 tablets by mouth daily.    [provider]  Biotin 61537 MCG TABS Take 10,000 mcg by mouth daily.    [provider]  guaiFENesin (MUCINEX) 600 MG 12 hr tablet Take 600 mg by mouth 2 (two) times daily as needed (Congestion).    [provider]  levETIRAcetam (KEPPRA) 1000 MG tablet Take 1 tablet (1,000 mg total) by mouth 2 (two) times daily. 11/26/18   Leatha Gilding, MD  Multiple Vitamin (MULTIVITAMIN WITH MINERALS) TABS tablet Take 1 tablet by mouth daily.    [provider]  naproxen sodium (ALEVE) 220 MG tablet Take 440 mg by mouth daily as needed (pain).    [provider]  Pseudoeph-Doxylamine-DM-APAP (NYQUIL PO) Take 1 Dose by mouth daily as needed (Itchy Throat).    [provider]    Family History Family History  Problem Relation Age of Onset  . Diabetes Mother   . Hypertension Mother   . Hypertension Father     Social History Social History   Tobacco Use  . Smoking status: Current Some Day Smoker    Packs/day: 0.25    Types: Cigarettes  . Smokeless tobacco: Never Used  Substance Use Topics  . Alcohol use: Yes    Comment: occasional  . Drug use: Yes    Types: Marijuana     Allergies   Tramadol; Augmentin [amoxicillin-pot clavulanate]; Nickel; and Silver   Review of Systems Review of Systems  All other systems reviewed and are negative.    Physical Exam Updated Vital Signs BP 124/76   Pulse 82   Temp 99.2 F (37.3 C) (Oral)   Resp 16   SpO2 98%   Physical Exam Vitals signs and nursing note reviewed.  Constitutional:      Appearance: He is well-developed.  HENT:     Head: Normocephalic and atraumatic.  Eyes:     General: No scleral icterus.       Right eye: No discharge.        Left eye: No discharge.  Conjunctiva/sclera: Conjunctivae normal.     Pupils: Pupils are equal, round, and reactive to light.  Neck:     Musculoskeletal: Normal range of motion.     Vascular: No JVD.     Trachea: No tracheal deviation.  Pulmonary:     Effort: Pulmonary effort is normal.     Breath sounds: No stridor.  Musculoskeletal:     Comments: Nodule in the fourth and fifth interdigital space, firm rubbery no tenderness, no erythema  Neurological:     Mental Status: He is alert and oriented to person, place, and time.     Coordination: Coordination normal.  Psychiatric:        Behavior: Behavior normal.        Thought Content: Thought content normal.        Judgment: Judgment normal.      ED Treatments / Results  Labs (all labs ordered are listed, but only abnormal results are displayed) Labs Reviewed - No  data to display  EKG None  Radiology No results found.  Procedures Procedures (including critical care time)  Medications Ordered in ED Medications - No data to display   Initial Impression / Assessment and Plan / ED Course  I have reviewed the triage vital signs and the nursing notes.  Pertinent labs & imaging results that were available during my care of the patient were reviewed by me and considered in my medical decision making (see chart for details).        Labs:   Imaging:  Consults:  Therapeutics:  Discharge Meds:   Assessment/Plan: Symptoms most consistent with gland cyst.  No signs of infectious etiology.  Refer to podiatry return precautions given.  Verbalized understanding and agreement to today's plan.   Final Clinical Impressions(s) / ED Diagnoses   Final diagnoses:  Ganglion cyst    ED Discharge Orders    None       Eyvonne MechanicHedges, Kylani Wires, PA-C 12/17/18 1128    Alvira MondaySchlossman, Erin, MD 12/18/18 1440

## 2018-12-18 ENCOUNTER — Telehealth (INDEPENDENT_AMBULATORY_CARE_PROVIDER_SITE_OTHER): Payer: Self-pay | Admitting: Neurology

## 2018-12-18 ENCOUNTER — Other Ambulatory Visit: Payer: Self-pay

## 2018-12-18 DIAGNOSIS — G40009 Localization-related (focal) (partial) idiopathic epilepsy and epileptic syndromes with seizures of localized onset, not intractable, without status epilepticus: Secondary | ICD-10-CM

## 2018-12-18 MED ORDER — DIVALPROEX SODIUM ER 500 MG PO TB24: ORAL_TABLET | ORAL | 5 refills | Status: DC

## 2018-12-18 NOTE — Progress Notes (Signed)
Virtual Visit via Video Note The purpose of this virtual visit is to provide medical care while limiting exposure to the novel coronavirus.    Consent was obtained for video visit:  Yes.   Answered questions that patient had about telehealth interaction:  Yes.   I discussed the limitations, risks, security and privacy concerns of performing an evaluation and management service by telemedicine. I also discussed with the patient that there may be a patient responsible charge related to this service. The patient expressed understanding and agreed to proceed.  Pt location: Home Physician Location: office Name of referring provider:  Renne CriglerGeiple, Joshua, PA-C I connected with Todd Yoder at patients initiation/request on 12/18/2018 at  1:00 PM EDT by video enabled telemedicine application and verified that I am speaking with the correct person using two identifiers. Pt MRN:  [id number] Pt DOB:  May 12, 1986 Video Participants:  Todd Yoder   History of Present Illness:  This is a very pleasant 33 year old right-handed man with a history of bipolar disorder presenting for evaluation of new onset seizures. The first seizure occurred in his sleep on 10/04/2018. He had been sleep deprived for 1-2 days with only 4 hours of sleep, he recalled feeling tired and had a little headache then woke up inside the ambulance. His had bit his lip/tongue. [facility] where CBC, BMP, head CT without contrast were unremarkable except for slight cerebellar tonsillar ectopia. UDS positive for THC. He had another seizure on 11/25/2018 again in his sleep. He again had sleep deprivation since they were short staffed at work, he works as a Lawyer[employer]NA. He got off work at [employer] and started moving his things into storage. He had a little nip of scotch and brandy, lay down, then woke up to family pushing him to go to the hospital. If felt like his muscles had a rigorous workout. [facility] and as he was getting an  MRI, he recalls hyperventilating when his head was put in the helmet vise, then he woke up 5 hours later with shoulder pain R>L. He was reportedly foaming at the mouth with tongue bite, with a convulsive seizure lasting a couple of minutes. He had an MRI brain without contrast which I personally reviewed, no acute changes, hippocampi symmetric with no abnormal signal. 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 His wake and sleep EEG was normal. He was discharged home on Keppra but stopped it after a week because it was making him hostile. His mother takes Depakote so he had been taking his mother's Depakote 500mg  BID since then and denies any further convulsions.  He reports recurrent symptoms since 2018 when he started having panic attacks out of nowhere. He would start sweating and fell weak, like he would fall. Standing in front of a fan seemed to help, it would last 5-6 minutes then recur for several times. He has also been having a sensation running up and down his chest and back, an uncontrollable cool sensation that has quieted down some since starting medication. He recalls an episode at age 33 [place] [facility] and it felt like his whole body paused, then he snapped back with the car still in motion. He attributed this to smoking marijuana. He denies any staring/unresponsive episodes, gaps in time, olfactory/gustatory hallucinations, focal numbness/tingling/weakness, myoclonic jerks. Since Sunday, he would start having  a pounding headache right before he takes his dose of Depakote. He denies any dizziness, diplopia, neck/back pain. No side effects on Depakote.   Epilepsy Risk Factors:  His mother, maternal aunt, and maternal cousin have seizures. Otherwise he had a normal birth and early development.  There is no history of febrile convulsions, CNS infections such as  meningitis/encephalitis, significant traumatic brain injury, neurosurgical procedures.  PAST MEDICAL HISTORY: Past Medical History:  Diagnosis Date   Bipolar disorder ([place])    Family history of adverse reaction to anesthesia    problem with aunt waking up after surgery   GERD (gastroesophageal reflux disease)     PAST SURGICAL HISTORY: Past Surgical History:  Procedure Laterality Date   KNEE ARTHROSCOPY WITH MENISCAL REPAIR Right 09/19/2017   Procedure: KNEE ARTHROSCOPY WITH MENISCAL REPAIR;  Surgeon: Renette Butters, MD;  Location: [place];  Service: Orthopedics;  Laterality: Right;   TOOTH EXTRACTION      MEDICATIONS: Current Outpatient Medications on File Prior to Visit  Medication Sig Dispense Refill   Ascorbic Acid (VITAMIN C PO) Take 3 tablets by mouth daily.     Biotin 10000 MCG TABS Take 10,000 mcg by mouth daily.     guaiFENesin (MUCINEX) 600 MG 12 hr tablet Take 600 mg by mouth 2 (two) times daily as needed (Congestion).     naproxen sodium (ALEVE) 220 MG tablet Take 440 mg by mouth daily as needed (pain).     Pseudoeph-Doxylamine-DM-APAP (NYQUIL PO) Take 1 Dose by mouth daily as needed (Itchy Throat).     levETIRAcetam (KEPPRA) 1000 MG tablet Take 1 tablet (1,000 mg total) by mouth 2 (two) times daily. (Patient not taking: Reported on 12/18/2018) 60 tablet 1   No current facility-administered medications on file prior to visit.     ALLERGIES: Allergies  Allergen Reactions   Tramadol Nausea And Vomiting   Augmentin [Amoxicillin-Pot Clavulanate] Rash and Other (See Comments)    Has patient had a PCN reaction causing immediate rash, facial/tongue/throat swelling, SOB or lightheadedness with hypotension: No Has patient had a PCN reaction causing severe rash involving mucus membranes or skin necrosis: Yes Has patient had a PCN reaction that required hospitalization Yes Has patient had a PCN reaction occurring within the last 10 years: No If  all of the above answers are "NO", then may proceed with Cephalosporin use.    Nickel Rash    Burning    Silver Rash    Burning     FAMILY HISTORY: Family History  Problem Relation Age of Onset   Diabetes Mother    Hypertension Mother    Hypertension Father     Observations/Objective:   GEN:  The patient appears stated age and is in NAD.  Neurological examination: Patient is awake, alert, oriented x 3. No aphasia or dysarthria. Intact fluency and comprehension. Remote and recent memory intact. Able to name and repeat. Cranial nerves: Extraocular movements intact with no nystagmus. No facial asymmetry. Motor: moves all extremities symmetrically, at least anti-gravity x 4. No incoordination on finger to nose testing. Gait: narrow-based and steady, able to tandem walk adequately. Negative Romberg test.   Assessment and Plan:   This is a very pleasant 33 year old right-handed man with a history of bipolar disorder presenting with new onset seizures. He has had 2 nocturnal convulsions and a convulsion witnessed in the ER, most recently 11/25/2018. He is also reporting recurrent episodes of sensory symptoms that appear to have decreased some with seizure medication.  Continue Depakote ER 500mg  BID. A 48-hour EEG will be ordered to further classify his symptoms.  [place] driving laws were discussed with the patient, and he knows to stop driving after a seizure, until 6 months seizure-free. He will follow-up after 3 months and knows to call for any changes.   Follow Up Instructions:   -I discussed the assessment and treatment plan with the patient. The patient was provided an opportunity to ask questions and all were answered. The patient agreed with the plan and demonstrated an understanding of the instructions.   The patient was advised to call back or seek an in-person evaluation if the symptoms worsen or if the condition fails to improve as anticipated.     Van ClinesKaren M Virlan Kempker, MD

## 2018-12-25 ENCOUNTER — Other Ambulatory Visit: Payer: Self-pay

## 2018-12-25 DIAGNOSIS — G40009 Localization-related (focal) (partial) idiopathic epilepsy and epileptic syndromes with seizures of localized onset, not intractable, without status epilepticus: Secondary | ICD-10-CM

## 2018-12-25 NOTE — Progress Notes (Signed)
Amb. 48 hr EEG ordered

## 2018-12-26 ENCOUNTER — Telehealth: Payer: Self-pay | Admitting: *Deleted

## 2018-12-26 NOTE — Telephone Encounter (Signed)
LMOM to call back to schedule 48 hour ambulatory EEG 

## 2018-12-31 ENCOUNTER — Telehealth: Payer: Self-pay | Admitting: *Deleted

## 2018-12-31 NOTE — Telephone Encounter (Signed)
Noted  

## 2018-12-31 NOTE — Telephone Encounter (Signed)
Called to schedule 48 hour ambulatory EEG and he said it was not a good time that he will call me when he wants to make the appointment. He said to hang on he was getting something to write with and he hung up. Before he hung up I told him to call Dr. Amparo Bristol office and tell whoever answers the phone that he is calling about setting up the ambulatory EEG appointment and they will transfer him to me. He said ok and apologized that now is not a good time.

## 2019-01-21 ENCOUNTER — Telehealth: Payer: Self-pay | Admitting: Neurology

## 2019-01-21 NOTE — Telephone Encounter (Signed)
Left message with the after hour answering service on 01-18-19 @ 5:30 pm    Caller states that he was supposed to set up a 48 hour EEG  Please call

## 2019-01-25 NOTE — Telephone Encounter (Signed)
LMOM to call back at [phone] with calendar handy so we can schedule his ambulatory EEG. Let him know it will be August when we can do this.

## 2019-01-28 ENCOUNTER — Telehealth: Payer: Self-pay | Admitting: *Deleted

## 2019-01-28 NOTE — Telephone Encounter (Signed)
LMOM to call me back to set up ambulatory EEG.

## 2019-02-20 ENCOUNTER — Telehealth: Payer: Self-pay | Admitting: *Deleted

## 2019-02-20 NOTE — Telephone Encounter (Signed)
LMOM I am returning your phone call to schedule ambulatory EEG. As of right now 03/04/19 1:00pm is available.

## 2019-03-13 NOTE — Telephone Encounter (Signed)
LMOM to call me back to schedule 48 hour EEG

## 2019-03-19 ENCOUNTER — Other Ambulatory Visit: Payer: Self-pay

## 2019-03-19 ENCOUNTER — Encounter: Payer: Self-pay | Admitting: Neurology

## 2019-03-19 ENCOUNTER — Telehealth (INDEPENDENT_AMBULATORY_CARE_PROVIDER_SITE_OTHER): Payer: Self-pay | Admitting: Neurology

## 2019-03-19 VITALS — Ht 77.0 in | Wt 225.0 lb

## 2019-03-19 DIAGNOSIS — G40009 Localization-related (focal) (partial) idiopathic epilepsy and epileptic syndromes with seizures of localized onset, not intractable, without status epilepticus: Secondary | ICD-10-CM

## 2019-03-19 DIAGNOSIS — G43009 Migraine without aura, not intractable, without status migrainosus: Secondary | ICD-10-CM

## 2019-03-19 NOTE — Progress Notes (Signed)
Virtual Visit via Video Note The purpose of this virtual visit is to provide medical care while limiting exposure to the novel coronavirus.    Consent was obtained for video visit:  Yes.   Answered questions that patient had about telehealth interaction:  Yes.   I discussed the limitations, risks, security and privacy concerns of performing an evaluation and management service by telemedicine. I also discussed with the patient that there may be a patient responsible charge related to this service. The patient expressed understanding and agreed to proceed.  Pt location: Home Physician Location: office Name of referring provider:  No ref. provider found I connected with Todd Yoder at patients initiation/request on 03/19/2019 at  3:00 PM EDT by video enabled telemedicine application and verified that I am speaking with the correct person using two identifiers. Pt MRN:  [id number] Pt DOB:  [birth year] Video Participants:  [name]   History of Present Illness:  The patient was seen as a virtual video visit on 03/19/2019. He was last seen 3 months ago for recurrent seizures. He was started on Depakote ER 500mg  BID on last visit. He denies any convulsions since May 2020, but reports episodes of uncontrollable body spasms, sometimes his arms or shoulder will start shaking, usually when very stressed out. He would be awake and aware, no speech difficulties or focal weakness. The more he tries to stop it, the less he is able to control it. It can take 45 minutes to quiet down. He was driving yesterday and got stopped by the police which was very stressful. When he got home, he was smoking a cigarette when his toes started flexing and his legs started having spasms. He tried to lock his body but it kep moving for 20 minutes. Legs felt tired after. He had recurrent panic attacks yesterday where he would fell a chill then start heating up, making him weak. It kept going on for hours. He took an  extra dose of Depakote and felt better. He is also having occasional headaches, no associated nausea/vomiting. Pain is in the center of his head, it feels like the hemispheres are trying to separate from the middle, with pulsating pain. He noticed this 2-3 weeks ago when he was not taking his Depakote on time or missed a couple of doses, but has been taking it regularly since then with continued pains. No focal numbness/tingling/weakness. No staring/unresponsive episodes reported by family or at work. No falls.   History on Initial Assessment 12/18/2018: This is a very pleasant 33 year old right-handed man with a history of bipolar disorder presenting for evaluation of new onset seizures. The first seizure occurred in his sleep on 10/04/2018. He had been sleep deprived for 1-2 days with only 4 hours of sleep, he recalled feeling tired and had a little headache then woke up inside the ambulance. His had bit his lip/tongue. He was brought to [facility] where CBC, BMP, head CT without contrast were unremarkable except for slight cerebellar tonsillar ectopia. UDS positive for THC. He had another seizure on 11/25/2018 again in his sleep. He again had sleep deprivation since they were short staffed at work, he works as a Quarry manager. He got off work at [employer] and started moving his things into storage. He had a little nip of scotch and brandy, lay down, then woke up to family pushing him to go to the hospital. If felt like his muscles had a rigorous workout. He was brought to [facility] and as  he was getting an MRI, he recalls hyperventilating when his head was put in the helmet vise, then he woke up 5 hours later with shoulder pain R>L. He was reportedly foaming at the mouth with tongue bite, with a convulsive seizure lasting a couple of minutes. He had an MRI brain without contrast which I personally reviewed, no acute changes, hippocampi symmetric with no abnormal signal. 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 His wake and sleep EEG was normal. He was discharged home on Keppra but stopped it after a week because it was making him hostile. His mother takes Depakote so he had been taking his mother's Depakote 500mg  BID since then and denies any further convulsions.  He reports recurrent symptoms since 2018 when he started having panic attacks out of nowhere. He would start sweating and fell weak, like he would fall. Standing in front of a fan seemed to help, it would last 5-6 minutes then recur for several times. He has also been having a sensation running up and down his chest and back, an uncontrollable cool sensation that has quieted down some since starting medication. He recalls an episode at age 33 when [facility] and it felt like his whole body paused, then he snapped back with the car still in motion. He attributed this to smoking marijuana. He denies any staring/unresponsive episodes, gaps in time, olfactory/gustatory hallucinations, focal numbness/tingling/weakness, myoclonic jerks. Since Sunday, he would start having a pounding headache right before he takes his dose of Depakote. He denies any dizziness, diplopia, neck/back pain. No side effects on Depakote.   Epilepsy Risk Factors:  His mother, maternal aunt, and maternal cousin have seizures. Otherwise he had a normal birth and early development.  There is no history of febrile convulsions, CNS infections such as meningitis/encephalitis, significant traumatic brain injury, neurosurgical procedures.    Current Outpatient Medications on File Prior to Visit  Medication Sig Dispense Refill  . Ascorbic Acid (VITAMIN C PO) Take 3 tablets by mouth daily.    . Biotin 1610910000 MCG TABS Take 10,000 mcg by mouth daily.    . divalproex (DEPAKOTE ER) 500 MG 24 hr tablet Take 1 tablet twice a day 60 tablet 5  . guaiFENesin (MUCINEX) 600 MG 12 hr tablet Take 600 mg by mouth  2 (two) times daily as needed (Congestion).    . naproxen sodium (ALEVE) 220 MG tablet Take 440 mg by mouth daily as needed (pain).    . Pseudoeph-Doxylamine-DM-APAP (NYQUIL PO) Take 1 Dose by mouth daily as needed (Itchy Throat).     No current facility-administered medications on file prior to visit.      Observations/Objective:   Vitals:   03/19/19 1449  Weight: 225 lb (102.1 kg)  Height: 6\' 5"  (1.956 m)   GEN:  The patient appears stated age and is in NAD.  Neurological examination: Patient is awake, alert, oriented x 3. No aphasia or dysarthria. Intact fluency and comprehension. Remote and recent memory intact. Able to name and repeat. Cranial nerves: Extraocular movements intact with no nystagmus. No facial asymmetry. Motor: moves all extremities symmetrically, at least anti-gravity x 4. No incoordination on finger to nose testing. Gait: narrow-based and steady, able to tandem walk adequately. Negative Romberg test. No body jerks or spasms noted today.    Assessment and Plan:   This is a very pleasant 33 yo RH man with  a history of bipolar disorder with new onset seizures since March 2020. He has had 2 nocturnal convulsions and a convulsion witnessed in the ER, last convulsion May 2020. He continues to report uncontrollable body jerks and panic attacks, increase Depakote ER to 500mg  in AM, 1000mg  in PM. This may help with headaches as well, headaches have migrainous quality described. Proceed with 48-hour EEG to further classify seizures. We again discussed [place] driving laws to stop driving after a seizure, until 6 months seizure-free. He will follow-up after 3 months and knows to call for any changes.   Follow Up Instructions:    -I discussed the assessment and treatment plan with the patient. The patient was provided an opportunity to ask questions and all were answered. The patient agreed with the plan and demonstrated an understanding of the instructions.   The patient was advised  to call back or seek an in-person evaluation if the symptoms worsen or if the condition fails to improve as anticipated.    Van Clines, MD

## 2019-03-26 MED ORDER — DIVALPROEX SODIUM ER 500 MG PO TB24: ORAL_TABLET | ORAL | 11 refills | Status: DC

## 2019-07-23 ENCOUNTER — Telehealth: Payer: Self-pay | Admitting: Neurology

## 2019-07-29 ENCOUNTER — Telehealth (INDEPENDENT_AMBULATORY_CARE_PROVIDER_SITE_OTHER): Payer: Self-pay | Admitting: Neurology

## 2019-07-29 ENCOUNTER — Encounter: Payer: Self-pay | Admitting: Neurology

## 2019-07-29 ENCOUNTER — Other Ambulatory Visit: Payer: Self-pay

## 2019-07-29 VITALS — Ht 77.0 in | Wt 230.0 lb

## 2019-07-29 DIAGNOSIS — G43009 Migraine without aura, not intractable, without status migrainosus: Secondary | ICD-10-CM

## 2019-07-29 DIAGNOSIS — G40009 Localization-related (focal) (partial) idiopathic epilepsy and epileptic syndromes with seizures of localized onset, not intractable, without status epilepticus: Secondary | ICD-10-CM

## 2019-07-29 MED ORDER — DIVALPROEX SODIUM ER 500 MG PO TB24: ORAL_TABLET | ORAL | 11 refills | Status: DC

## 2019-07-29 MED ORDER — SUMATRIPTAN SUCCINATE 50 MG PO TABS: 50.0000 mg | ORAL_TABLET | ORAL | 11 refills | Status: DC | PRN

## 2019-07-29 NOTE — Progress Notes (Signed)
Virtual Visit via Video Note The purpose of this virtual visit is to provide medical care while limiting exposure to the novel coronavirus.    Consent was obtained for video visit:  Yes.   Answered questions that patient had about telehealth interaction:  Yes.   I discussed the limitations, risks, security and privacy concerns of performing an evaluation and management service by telemedicine. I also discussed with the patient that there may be a patient responsible charge related to this service. The patient expressed understanding and agreed to proceed.  Pt location: Home Physician Location: office Name of referring provider:  No ref. provider found I connected with NICHOLAD KAUTZMAN at patients initiation/request on 07/29/2019 at  4:00 PM EST by video enabled telemedicine application and verified that I am speaking with the correct person using two identifiers. Pt MRN:  [id number] Pt DOB:  1986/07/11 Video Participants:  Toribio Harbour   History of Present Illness:  The patient was seen as a virtual video visit on 07/29/2019. His mother is present during the e-visit to provide additional information. He has had 2 nocturnal seizures and another seizure witnessed in the ER, none since May 2020. He was reporting episodes of uncontrollable body spasms, these have quieted down with increase in Depakote ER to 500mg  in AM, 1000mg  in PM. He has occasional "moments of chills" when he is late taking his medication. No side effects except for some weight gain. He has occasional migraines, he had a migraine last week with nausea, face was very tender. His mother gave him her sumatriptan, which really worked to calm it down. His mother reports concern that he would have a seizure when he is stressed out, she states he brings work home, his head starts buzzing and his neck swells. He has a hard time sleeping. She has seizures herself worsened by stress and has clorazepate which she has given him a few  times to calm him down. He usually works 3 days a week 12-hour shifts, but with recent pandemic, has been asked to work 7-day 8-hr shifts recently.   History on Initial Assessment 12/18/2018: This is a very pleasant 34 year old right-handed man with a history of bipolar disorder presenting for evaluation of new onset seizures. The first seizure occurred in his sleep on 10/04/2018. He had been sleep deprived for 1-2 days with only 4 hours of sleep, he recalled feeling tired and had a little headache then woke up inside the ambulance. His had bit his lip/tongue. He was brought to [facility] where CBC, BMP, head CT without contrast were unremarkable except for slight cerebellar tonsillar ectopia. UDS positive for THC. He had another seizure on 11/25/2018 again in his sleep. He again had sleep deprivation since they were short staffed at work, he works as a [employer]. He got off work at 11/27/2018 and started moving his things into storage. He had a little nip of scotch and brandy, lay down, then woke up to family pushing him to go to the hospital. If felt like his muscles had a rigorous workout. He was brought to [facility] and as he was getting an MRI, he recalls hyperventilating when his head was put in the helmet vise, then he woke up 5 hours later with shoulder pain R>L. He was reportedly foaming at the mouth with tongue bite, with a convulsive seizure lasting a couple of minutes. He had an MRI brain without contrast which I personally reviewed, no acute changes, hippocampi symmetric with no abnormal  signal. 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 His wake and sleep EEG was normal. He was discharged home on Keppra but stopped it after a week because it was making him hostile. His mother takes Depakote so he had been taking his mother's Depakote 500mg  BID since then and denies any further convulsions.  He reports recurrent symptoms  since 2018 when he started having panic attacks out of nowhere. He would start sweating and fell weak, like he would fall. Standing in front of a fan seemed to help, it would last 5-6 minutes then recur for several times. He has also been having a sensation running up and down his chest and back, an uncontrollable cool sensation that has quieted down some since starting medication. He recalls an episode at age 5 when he was driving to [facility] and it felt like his whole body paused, then he snapped back with the car still in motion. He attributed this to smoking marijuana. He denies any staring/unresponsive episodes, gaps in time, olfactory/gustatory hallucinations, focal numbness/tingling/weakness, myoclonic jerks. Since Sunday, he would start having a pounding headache right before he takes his dose of Depakote. He denies any dizziness, diplopia, neck/back pain. No side effects on Depakote.   Epilepsy Risk Factors:  His mother, maternal aunt, and maternal cousin have seizures. Otherwise he had a normal birth and early development.  There is no history of febrile convulsions, CNS infections such as meningitis/encephalitis, significant traumatic brain injury, neurosurgical procedures.    Current Outpatient Medications on File Prior to Visit  Medication Sig Dispense Refill  . Ascorbic Acid (VITAMIN C PO) Take 3 tablets by mouth daily.    . Biotin Wednesday MCG TABS Take 10,000 mcg by mouth daily.    . divalproex (DEPAKOTE ER) 500 MG 24 hr tablet Take 1 tab in AM, 2 tabs in PM 90 tablet 11  . guaiFENesin (MUCINEX) 600 MG 12 hr tablet Take 600 mg by mouth 2 (two) times daily as needed (Congestion).    . naproxen sodium (ALEVE) 220 MG tablet Take 440 mg by mouth daily as needed (pain).    . Pseudoeph-Doxylamine-DM-APAP (NYQUIL PO) Take 1 Dose by mouth daily as needed (Itchy Throat).     No current facility-administered medications on file prior to visit.     Observations/Objective:   Vitals:    07/29/19 1539  Weight: 230 lb (104.3 kg)  Height: 6\' 5"  (1.956 m)   GEN:  The patient appears stated age and is in NAD.  Neurological examination: Patient is awake, alert, oriented x 3. No aphasia or dysarthria. Intact fluency and comprehension. Remote and recent memory intact. Able to name and repeat. Cranial nerves: Extraocular movements intact with no nystagmus. No facial asymmetry. Motor: moves all extremities symmetrically, at least anti-gravity x 4.    Assessment and Plan:   This is a very pleasant 34 yo RH man with a history of bipolar disorder with new onset seizures since March 2020. He has had 2 nocturnal convulsions and a convulsion witnessed in the ER, last convulsion May 2020. He is doing well on Depakote ER 500mg  in AM, 1000mg  in PM. He had good response to sumatriptan 50mg  prn, refills sent. We discussed that if anxiety worsens, discuss medication management with PCP. We again discussed [place] driving laws to stop driving after a seizure, until 6 months seizure-free. He will follow-up after 4-5 months and knows to call for  any changes.   Follow Up Instructions:   -I discussed the assessment and treatment plan with the patient. The patient was provided an opportunity to ask questions and all were answered. The patient agreed with the plan and demonstrated an understanding of the instructions.   The patient was advised to call back or seek an in-person evaluation if the symptoms worsen or if the condition fails to improve as anticipated.    Cameron Sprang, MD

## 2019-11-28 ENCOUNTER — Ambulatory Visit: Payer: Self-pay | Admitting: Neurology

## 2020-02-10 ENCOUNTER — Telehealth: Payer: Self-pay | Admitting: Neurology

## 2020-02-10 NOTE — Telephone Encounter (Signed)
Patient called in to make an appointment and just wanted Dr. Karel Jarvis to be aware that he had a seizure 2 and a half months ago, but other than that everything is going good.

## 2020-02-11 ENCOUNTER — Other Ambulatory Visit: Payer: Self-pay

## 2020-02-11 ENCOUNTER — Telehealth (INDEPENDENT_AMBULATORY_CARE_PROVIDER_SITE_OTHER): Payer: Self-pay | Admitting: Neurology

## 2020-02-11 ENCOUNTER — Encounter: Payer: Self-pay | Admitting: Neurology

## 2020-02-11 VITALS — Ht 77.0 in | Wt 238.0 lb

## 2020-02-11 DIAGNOSIS — G40009 Localization-related (focal) (partial) idiopathic epilepsy and epileptic syndromes with seizures of localized onset, not intractable, without status epilepticus: Secondary | ICD-10-CM

## 2020-02-11 DIAGNOSIS — M25511 Pain in right shoulder: Secondary | ICD-10-CM

## 2020-02-11 DIAGNOSIS — M25512 Pain in left shoulder: Secondary | ICD-10-CM

## 2020-02-11 MED ORDER — DIVALPROEX SODIUM ER 500 MG PO TB24: ORAL_TABLET | ORAL | 11 refills | Status: DC

## 2020-02-11 NOTE — Telephone Encounter (Signed)
Scheduled for virtual visit 02/11/20.

## 2020-02-11 NOTE — Progress Notes (Signed)
Virtual Visit via Video Note The purpose of this virtual visit is to provide medical care while limiting exposure to the novel coronavirus.    Consent was obtained for video visit:  Yes.   Answered questions that patient had about telehealth interaction:  Yes.   I discussed the limitations, risks, security and privacy concerns of performing an evaluation and management service by telemedicine. I also discussed with the patient that there may be a patient responsible charge related to this service. The patient expressed understanding and agreed to proceed.  Pt location: Home Physician Location: office Name of referring provider:  No ref. provider found I connected with ADONIS YIM at patients initiation/request on 02/11/2020 at 10:00 AM EDT by video enabled telemedicine application and verified that I am speaking with the correct person using two identifiers. Pt MRN:  [id number] Pt DOB:  04-19-1986 Video Participants:  Toribio Harbour   History of Present Illness:  The patient had a telephone visit on 02/11/2020. Unable to connect via video due to technical difficulties. He was last seen in the neurology clinic 7 months ago for recurrent seizures. He reports a nocturnal seizure 4-5 weeks ago that occurred in the setting of missing 3 days of Depakote, sleep deprivation, and stress. He had been pushing himself at work, working 50 hours a week (instead of 36 hrs) and had a longer commute. Work is high stress. His mother alerted him that he had 2 seizures back to back, she states they were pretty violent and he stopped breathing briefly. For several days after the seizure, he felt confused, he could not get comfortable with his temperature, feeling hot and cold at the same time. There was a buzzing in his head, making him feel sluggish. He feels much better now but continues to have bilateral shoulder pain. He can lift his arms over his head but they are quite painful. He is back to taking  Depakote 500mg  in AM, 1000mg  in PM. He reports that when he takes his medication regularly, the seizures are quiet. He is not driving. He works prn and took 2-3 weeks break, planning to return to work 3 days a week starting 8/9.    History on Initial Assessment 12/18/2018: This is a very pleasant 34 year old right-handed man with a history of bipolar disorder presenting for evaluation of new onset seizures. The first seizure occurred in his sleep on 10/04/2018. He had been sleep deprived for 1-2 days with only 4 hours of sleep, he recalled feeling tired and had a little headache then woke up inside the ambulance. His had bit his lip/tongue. He was brought to [facility] where CBC, BMP, head CT without contrast were unremarkable except for slight cerebellar tonsillar ectopia. UDS positive for THC. He had another seizure on 11/25/2018 again in his sleep. He again had sleep deprivation since they were short staffed at work, he works as a [employer]. He got off work at 11/27/2018 and started moving his things into storage. He had a little nip of scotch and brandy, lay down, then woke up to family pushing him to go to the hospital. If felt like his muscles had a rigorous workout. He was brought to [facility] and as he was getting an MRI, he recalls hyperventilating when his head was put in the helmet vise, then he woke up 5 hours later with shoulder pain R>L. He was reportedly foaming at the mouth with tongue bite, with a convulsive seizure lasting a couple of minutes. He  had an MRI brain without contrast which I personally reviewed, no acute changes, hippocampi symmetric with no abnormal signal. 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 His wake and sleep EEG was normal. He was discharged home on Keppra but stopped it after a week because it was making him hostile. His mother takes Depakote so he had been taking his mother's Depakote 500mg  BID  since then and denies any further convulsions.  He reports recurrent symptoms since 2018 when he started having panic attacks out of nowhere. He would start sweating and fell weak, like he would fall. Standing in front of a fan seemed to help, it would last 5-6 minutes then recur for several times. He has also been having a sensation running up and down his chest and back, an uncontrollable cool sensation that has quieted down some since starting medication. He recalls an episode at age 8 when he was driving to [facility] and it felt like his whole body paused, then he snapped back with the car still in motion. He attributed this to smoking marijuana. He denies any staring/unresponsive episodes, gaps in time, olfactory/gustatory hallucinations, focal numbness/tingling/weakness, myoclonic jerks. Since Sunday, he would start having a pounding headache right before he takes his dose of Depakote. He denies any dizziness, diplopia, neck/back pain. No side effects on Depakote.   Epilepsy Risk Factors:  His mother, maternal aunt, and maternal cousin have seizures. Otherwise he had a normal birth and early development.  There is no history of febrile convulsions, CNS infections such as meningitis/encephalitis, significant traumatic brain injury, neurosurgical procedures.   Current Outpatient Medications on File Prior to Visit  Medication Sig Dispense Refill  . Ascorbic Acid (VITAMIN C PO) Take 3 tablets by mouth daily.    . divalproex (DEPAKOTE ER) 500 MG 24 hr tablet Take 1 tab in AM, 2 tabs in PM 90 tablet 11  . guaiFENesin (MUCINEX) 600 MG 12 hr tablet Take 600 mg by mouth 2 (two) times daily as needed (Congestion).    . naproxen sodium (ALEVE) 220 MG tablet Take 440 mg by mouth daily as needed (pain).    . Pseudoeph-Doxylamine-DM-APAP (NYQUIL PO) Take 1 Dose by mouth daily as needed (Itchy Throat).    . SUMAtriptan (IMITREX) 50 MG tablet Take 1 tablet (50 mg total) by mouth every 2 (two) hours as needed  for migraine. May repeat in 2 hours if headache persists or recurs. 10 tablet 11   No current facility-administered medications on file prior to visit.     Observations/Objective:   Vitals:   02/11/20 0939  Weight: 238 lb (108 kg)  Height: 6\' 5"  (1.956 m)   Exam limited due to nature of phone visit. Patient is awake, alert, able to answer questions without confusion or dysarthria.   Assessment and Plan:   This is a very pleasant 34 yo RH man with a history of bipolar disorder with new onset seizures since March 2020. He mostly has nocturnal seizures, but also seizures in wakefulness. He had another nocturnal seizure 4-5 weeks ago in the setting of missing medication, sleep deprivation, and stress. He is back to taking Depakote ER 500mg  in AM, 1000mg  in PM. We again discussed avoidance of seizure triggers. Proceed with 48-hour EEG to classify seizures. He continues to have bilateral shoulder pain from the seizures, xrays will be ordered. [place] driving laws were again discussed, he is not driving. Follow-up  in 6 months, he knows to call for any changes.    Follow Up Instructions:   -I discussed the assessment and treatment plan with the patient. The patient was provided an opportunity to ask questions and all were answered. The patient agreed with the plan and demonstrated an understanding of the instructions.   The patient was advised to call back or seek an in-person evaluation if the symptoms worsen or if the condition fails to improve as anticipated.     Total Time spent in visit with the patient was:  17:51 minutes, of which 100% of the time was spent in counseling and/or coordinating care on the above.   Pt understands and agrees with the plan of care outlined.      Van Clines, MD

## 2020-02-11 NOTE — Addendum Note (Signed)
Addended by: Dimas Chyle on: 02/11/2020 04:18 PM   Modules accepted: Orders

## 2020-03-09 ENCOUNTER — Other Ambulatory Visit: Payer: Self-pay

## 2020-03-09 ENCOUNTER — Ambulatory Visit (INDEPENDENT_AMBULATORY_CARE_PROVIDER_SITE_OTHER): Payer: Self-pay | Admitting: Neurology

## 2020-03-09 DIAGNOSIS — M25512 Pain in left shoulder: Secondary | ICD-10-CM

## 2020-03-09 DIAGNOSIS — G40009 Localization-related (focal) (partial) idiopathic epilepsy and epileptic syndromes with seizures of localized onset, not intractable, without status epilepticus: Secondary | ICD-10-CM

## 2020-03-09 DIAGNOSIS — M25511 Pain in right shoulder: Secondary | ICD-10-CM

## 2020-03-17 ENCOUNTER — Telehealth: Payer: Self-pay | Admitting: Neurology

## 2020-03-17 MED ORDER — DIVALPROEX SODIUM ER 500 MG PO TB24: ORAL_TABLET | ORAL | 11 refills | Status: DC

## 2020-03-17 NOTE — Procedures (Signed)
ELECTROENCEPHALOGRAM REPORT  Dates of Recording: 03/09/2020 11:30AM to 03/11/2020 12:00PM  Patient's Name: Todd Yoder MRN: [id number] Date of Birth: 09-13-1985  Referring Provider: Dr. Patrcia Dolly  Procedure: 48-hour ambulatory video EEG  History: This is a 34 year old man with recurrent seizures, majority are nocturnal. EEG for classification.  Medications: Depakote  Technical Summary: This is a 48-hour multichannel digital video EEG recording measured by the international 10-20 system with electrodes applied with paste and impedances below 5000 ohms performed as portable with EKG monitoring.  The digital EEG was referentially recorded, reformatted, and digitally filtered in a variety of bipolar and referential montages for optimal display.    DESCRIPTION OF RECORDING: During maximal wakefulness, the background activity consisted of a symmetric 11 Hz posterior dominant rhythm which was reactive to eye opening.  There were no epileptiform discharges or focal slowing seen in wakefulness.  During the recording, the patient progresses through wakefulness, drowsiness, and Stage 2 sleep. There were frequent right anterior temporal sharp waves seen in sleep.   Events: On 8/30 at 2129 hours, he has a stomach/headache. Patient not on video. Electrographically, there were no EEG or EKG changes seen.  On 8/31 at 0745 (not on video) and 1343 hours, he has a migraine. He is sitting on the couch for the second event, no clinical changes seen. Electrographically, there were no EEG or EKG changes seen.  On 8/31 at 1849 hours, he has a shocking pain on the top of his head. Patient not on video. Electrographically, there were no EEG or EKG changes seen.  On 9/1 at 0903 and 0905 hours, he has a chill coming from the back of his head to the bottom of his spine. Patient not on video. Electrographically, there were no EEG or EKG changes seen.  There were 3 electrographic seizures captured on 9/1 at  0416 hours lasting 70 seconds, 9/1 at 0526 hours lasting 90 seconds, and on 9/1 at 0711 hours lasting 2.5 minutes with rhythmic 4-5 Hz theta activity evolving in frequency and amplitude over the right temporal region. Patient did not push button during these events, patient not on video during events.  EKG lead was unremarkable.  IMPRESSION: This 48-hour ambulatory video EEG study is abnormal due to the presence of: 1. Frequent right anterior temporal epileptiform discharges seen exclusively in sleep 2. Three electrographic seizures arising from the right temporal region lasting 70 seconds to 2.5 minutes with no clinical symptoms reported.   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 There were 3 electrographic seizures captured arising from the right temporal region. Episodes of migraine, stomach/headache, shooting pain, chill going down body, did not show any electrographic correlate. Clinical correlation is advised.   Patrcia Dolly, M.D.

## 2020-03-17 NOTE — Telephone Encounter (Signed)
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 He was not aware of these. He reports a nocturnal seizure a month ago ( 2 seizures back to back), had missed doses 3 days in a row. He did not miss medications around time of EEG. Discussed need to increase dose of medication, he is agreeable to increasing Depakote 500mg : Take 1 tab in AM, 3 tabs in PM. He may need to be on 2 AEDs or switch, although it appears most breakthrough convulsions have occurred in setting of missed medication.

## 2020-04-03 ENCOUNTER — Other Ambulatory Visit: Payer: Self-pay

## 2020-04-03 ENCOUNTER — Telehealth: Payer: Self-pay | Admitting: Neurology

## 2020-04-03 MED ORDER — SUMATRIPTAN SUCCINATE 50 MG PO TABS: 50.0000 mg | ORAL_TABLET | ORAL | 3 refills | Status: DC | PRN

## 2020-04-03 MED ORDER — DIVALPROEX SODIUM ER 500 MG PO TB24: ORAL_TABLET | ORAL | 3 refills | Status: DC

## 2020-04-03 NOTE — Telephone Encounter (Signed)
Refill sent in

## 2020-04-03 NOTE — Telephone Encounter (Signed)
Patient called and said his pharmacy does not have the prescriptions for devalproex 500 MG or sumatriptan 50 MG.  [facility] on [address]

## 2020-10-01 ENCOUNTER — Ambulatory Visit: Payer: Self-pay | Admitting: Neurology

## 2020-10-09 ENCOUNTER — Other Ambulatory Visit: Payer: Self-pay

## 2020-10-09 ENCOUNTER — Encounter (HOSPITAL_COMMUNITY): Payer: Self-pay

## 2020-10-09 ENCOUNTER — Ambulatory Visit (HOSPITAL_COMMUNITY)
Admission: EM | Admit: 2020-10-09 | Discharge: 2020-10-09 | Disposition: A | Discharge status: Home / Self Care | Payer: Self-pay | Attending: Emergency Medicine | Admitting: Emergency Medicine

## 2020-10-09 DIAGNOSIS — R569 Unspecified convulsions: Secondary | ICD-10-CM | POA: Insufficient documentation

## 2020-10-09 DIAGNOSIS — N50812 Left testicular pain: Secondary | ICD-10-CM | POA: Insufficient documentation

## 2020-10-09 DIAGNOSIS — Z113 Encounter for screening for infections with a predominantly sexual mode of transmission: Secondary | ICD-10-CM | POA: Insufficient documentation

## 2020-10-09 DIAGNOSIS — F419 Anxiety disorder, unspecified: Secondary | ICD-10-CM | POA: Insufficient documentation

## 2020-10-09 HISTORY — DX: Unspecified convulsions: R56.9

## 2020-10-09 NOTE — Discharge Instructions (Addendum)
Follow up with your Primary Care Provider for your heart palpations/anxiety.  Follow up with your neurologist for your seizures.   We will contact you with the results from your lab work and any additional treatment.    Do not have sex while taking undergoing treatment for STI.  Make sure that all of your partners get tested and treated.   Use a condom or other barrier method for all sexual encounters.    Return or go to the Emergency Department if symptoms worsen or do not improve in the next few days.

## 2020-10-09 NOTE — ED Triage Notes (Addendum)
Pt states that he has been breaking out in sweats, and states that while at work he checked his heart rate and it went up into the 200s. States his heart rate will jump from the 50s to the 100s to 200s and back down. States this has been ongoing for years. Reports history of seizures. VSS on arrival. Pt does not appear to be in acute distress in triage, HR appears regular while checking vital signs. Notified Fowler NP of pt's complaint and that pt appears stable at this time.  Pt then states he has had left testicle swelling for past month.

## 2020-10-09 NOTE — ED Provider Notes (Signed)
[facility]    CSN: [id number] Arrival date & time: 10/09/20  1746      History   Chief Complaint Chief Complaint  Patient presents with  . Flushing  . Irregular Heart Beat  . left testicle swelling    HPI Todd Yoder is a 35 y.o. male.   Patient here for evaluation of left testicular swelling for the past month.  Reports having unprotected sex approximately 1 month ago.  Reports some pain and tenderness to left testicle.  Denies any dysuria, penile pain, or painful ejaculations.  Denies any discharge or lesions on penis.  Also reports intermittent heart palpitations tachycardia and diaphoresis that have been going on for the past year.  States heart rate has gone as high as the 200s.  Heart rate in office 79.  Does report history of anxiety.  Not currently symptomatic with palpitations.  Also reports history of seizures, taking medications and following up with neurology.  No issues or concerns with seizures at this time. Denies any specific alleviating or aggravating factors.  Denies any fevers, chest pain, shortness of breath, N/V/D, numbness, tingling, weakness, abdominal pain, or headaches.   ROS: As per HPI, all other pertinent ROS negative   The history is provided by the patient.    Past Medical History:  Diagnosis Date  . Bipolar disorder (HCC)   . Family history of adverse reaction to anesthesia    problem with aunt waking up after surgery  . GERD (gastroesophageal reflux disease)   . Seizures [facility])     Patient Active Problem List   Diagnosis Date Noted  . Seizures (HCC) 11/25/2018    Past Surgical History:  Procedure Laterality Date  . KNEE ARTHROSCOPY WITH MENISCAL REPAIR Right 09/19/2017   Procedure: KNEE ARTHROSCOPY WITH MENISCAL REPAIR;  Surgeon: Sheral Apley, MD;  Location: [facility];  Service: Orthopedics;  Laterality: Right;  . TOOTH EXTRACTION         Home Medications    Prior to Admission medications    Medication Sig Start Date End Date Taking? Authorizing Provider  Ascorbic Acid (VITAMIN C PO) Take 3 tablets by mouth daily.   Yes [provider]  divalproex (DEPAKOTE ER) 500 MG 24 hr tablet Take 1 tab in AM, 3 tabs in PM 04/03/20  Yes Van Clines, MD  guaiFENesin (MUCINEX) 600 MG 12 hr tablet Take 600 mg by mouth 2 (two) times daily as needed (Congestion).   Yes [provider]  naproxen sodium (ALEVE) 220 MG tablet Take 440 mg by mouth daily as needed (pain).   Yes [provider]  Pseudoeph-Doxylamine-DM-APAP (NYQUIL PO) Take 1 Dose by mouth daily as needed (Itchy Throat).   Yes [provider]  SUMAtriptan (IMITREX) 50 MG tablet Take 1 tablet (50 mg total) by mouth every 2 (two) hours as needed for migraine. May repeat in 2 hours if headache persists or recurs. 04/03/20  Yes Van Clines, MD    Family History Family History  Problem Relation Age of Onset  . Diabetes Mother   . Hypertension Mother   . Sarcoidosis Mother   . Hypertension Father     Social History Social History   Tobacco Use  . Smoking status: Former Smoker    Packs/day: 0.25    Types: Cigarettes  . Smokeless tobacco: Never Used  . Tobacco comment: smokes cigarettes occasionally  Vaping Use  . Vaping Use: Former  Substance Use Topics  . Alcohol use: Yes  Comment: occasional  . Drug use: Yes    Types: Marijuana     Allergies   Tramadol, Augmentin [amoxicillin-pot clavulanate], Nickel, and Silver   Review of Systems Review of Systems   Physical Exam Triage Vital Signs ED Triage Vitals  Enc Vitals Group     BP 10/09/20 1847 (!) 164/89     Pulse Rate 10/09/20 1847 79     Resp 10/09/20 1847 20     Temp 10/09/20 1847 98.6 F (37 C)     Temp src --      SpO2 10/09/20 1847 98 %     Weight --      Height --      Head Circumference --      Peak Flow --      Pain Score 10/09/20 1848 6     Pain Loc --      Pain Edu? --      Excl. in GC? --    No  data found.  Updated Vital Signs BP (!) 164/89 (BP Location: Right Arm)   Pulse 79   Temp 98.6 F (37 C)   Resp 20   SpO2 98%   Visual Acuity Right Eye Distance:   Left Eye Distance:   Bilateral Distance:    Right Eye Near:   Left Eye Near:    Bilateral Near:     Physical Exam Vitals and nursing note reviewed.  Constitutional:      General: He is not in acute distress.    Appearance: Normal appearance. He is not ill-appearing, toxic-appearing or diaphoretic.  HENT:     Head: Normocephalic and atraumatic.  Eyes:     Conjunctiva/sclera: Conjunctivae normal.  Cardiovascular:     Rate and Rhythm: Normal rate and regular rhythm.     Pulses: Normal pulses.     Heart sounds: Normal heart sounds.  Pulmonary:     Effort: Pulmonary effort is normal.     Breath sounds: Normal breath sounds.  Abdominal:     General: Abdomen is flat.  Genitourinary:    Penis: No phimosis, hypospadias, erythema, discharge or lesions.      Testes:        Right: Mass, tenderness, swelling, testicular hydrocele or varicocele not present. Right testis is descended. Cremasteric reflex is present.         Left: Tenderness (mild tenderness to palpation) present. Mass, swelling, testicular hydrocele or varicocele not present. Left testis is descended. Cremasteric reflex is present.      Epididymis:     Right: Normal.     Left: Normal.  Musculoskeletal:        General: Normal range of motion.     Cervical back: Normal range of motion.  Skin:    General: Skin is warm and dry.  Neurological:     General: No focal deficit present.     Mental Status: He is alert and oriented to person, place, and time.  Psychiatric:        Mood and Affect: Mood normal.      UC Treatments / Results  Labs (all labs ordered are listed, but only abnormal results are displayed) Labs Reviewed  CYTOLOGY, (ORAL, ANAL, URETHRAL) ANCILLARY ONLY    EKG   Radiology No results found.  Procedures Procedures (including  critical care time)  Medications Ordered in UC Medications - No data to display  Initial Impression / Assessment and Plan / UC Course  I have reviewed the triage vital signs and the  nursing notes.  Pertinent labs & imaging results that were available during my care of the patient were reviewed by me and considered in my medical decision making (see chart for details).     Screen for STD, Left testicular pain Assessment negative for red flags or concerns including epididymitis or testicular torsion.   Self swab obtained.  Will treat based on results. Safe sex practices discussed including using condoms or other barrier methods. Follow-up with PCP as needed  Anxiety  Episodic heart palpitations, tachycardia, and flushing most likely anxiety.  Follow-up with PCP for long-term management. ED for evaluation for chest pain, shortness of breath, or tachycardia.  Seizures Continue taking medications as previously prescribed. Follow-up with neurologist as scheduled  Final Clinical Impressions(s) / UC Diagnoses   Final diagnoses:  Screen for STD (sexually transmitted disease)  Testicular pain, left  Anxiety  Seizures [facility])     Discharge Instructions     Follow up with your Primary Care Provider for your heart palpations/anxiety.  Follow up with your neurologist for your seizures.   We will contact you with the results from your lab work and any additional treatment.    Do not have sex while taking undergoing treatment for STI.  Make sure that all of your partners get tested and treated.   Use a condom or other barrier method for all sexual encounters.    Return or go to the Emergency Department if symptoms worsen or do not improve in the next few days.      ED Prescriptions    None     PDMP not reviewed this encounter.   Ivette Loyal, NP 10/09/20 1928

## 2020-10-11 LAB — CYTOLOGY, (ORAL, ANAL, URETHRAL) ANCILLARY ONLY
Chlamydia: NEGATIVE
Comment: NEGATIVE
Comment: NEGATIVE
Comment: NORMAL
Neisseria Gonorrhea: NEGATIVE
Trichomonas: NEGATIVE

## 2020-12-21 ENCOUNTER — Emergency Department (HOSPITAL_COMMUNITY)
Admission: EM | Admit: 2020-12-21 | Discharge: 2020-12-22 | Disposition: A | Discharge status: Home / Self Care | Payer: BC Managed Care – PPO | Attending: Emergency Medicine | Admitting: Emergency Medicine

## 2020-12-21 ENCOUNTER — Other Ambulatory Visit: Payer: Self-pay

## 2020-12-21 ENCOUNTER — Emergency Department (HOSPITAL_COMMUNITY): Payer: BC Managed Care – PPO

## 2020-12-21 DIAGNOSIS — F17210 Nicotine dependence, cigarettes, uncomplicated: Secondary | ICD-10-CM | POA: Insufficient documentation

## 2020-12-21 DIAGNOSIS — R110 Nausea: Secondary | ICD-10-CM | POA: Diagnosis not present

## 2020-12-21 DIAGNOSIS — R079 Chest pain, unspecified: Secondary | ICD-10-CM | POA: Diagnosis not present

## 2020-12-21 DIAGNOSIS — R569 Unspecified convulsions: Secondary | ICD-10-CM | POA: Diagnosis not present

## 2020-12-21 DIAGNOSIS — R002 Palpitations: Secondary | ICD-10-CM | POA: Diagnosis not present

## 2020-12-21 LAB — BASIC METABOLIC PANEL
Anion gap: 7 (ref 5–15)
BUN: 17 mg/dL (ref 6–20)
CO2: 24 mmol/L (ref 22–32)
Calcium: 9.3 mg/dL (ref 8.9–10.3)
Chloride: 106 mmol/L (ref 98–111)
Creatinine, Ser: 1.06 mg/dL (ref 0.61–1.24)
GFR, Estimated: >60 mL/min (ref >60)
Glucose, Bld: 94 mg/dL (ref 70–99)
Potassium: 3.9 mmol/L (ref 3.5–5.1)
Sodium: 137 mmol/L (ref 135–145)

## 2020-12-21 LAB — CBC
HCT: 47.7 % (ref 39.0–52.0)
Hemoglobin: 15.7 g/dL (ref 13.0–17.0)
MCH: 29.7 pg (ref 26.0–34.0)
MCHC: 32.9 g/dL (ref 30.0–36.0)
MCV: 90.3 fL (ref 80.0–100.0)
Platelets: 192 10*3/uL (ref 150–400)
RBC: 5.28 MIL/uL (ref 4.22–5.81)
RDW: 13.6 % (ref 11.5–15.5)
WBC: 9.1 10*3/uL (ref 4.0–10.5)
nRBC: 0 % (ref 0.0–0.2)

## 2020-12-21 LAB — TROPONIN I (HIGH SENSITIVITY): Troponin I (High Sensitivity): 2 ng/L (ref <18)

## 2020-12-21 NOTE — ED Provider Notes (Signed)
Emergency Medicine Provider Triage Evaluation Note  Todd Yoder , a 35 y.o. male  was evaluated in triage.  Pt complains of chest pain that has been ongoing for 4 months.  Review of Systems  Positive: Chest pain Negative: sob  Physical Exam  BP (!) 152/84   Pulse 87   Temp 98.9 F (37.2 C) (Oral)   Resp 20   Ht 6\' 5"  (1.956 m)   Wt 117.9 kg   SpO2 98%   BMI 30.83 kg/m  Gen:   Awake, no distress   Resp:  Normal effort  MSK:   Moves extremities without difficulty  Other:  Lungs ctab, heart with rrr  Medical Decision Making  Medically screening exam initiated at 9:26 PM.  Appropriate orders placed.  Todd Yoder was informed that the remainder of the evaluation will be completed by another provider, this initial triage assessment does not replace that evaluation, and the importance of remaining in the ED until their evaluation is complete.     Toribio Harbour 12/21/20 2127    2128, MD 12/21/20 [phone]

## 2020-12-21 NOTE — ED Triage Notes (Signed)
Pt came in with c/o chest pain and palpitations for 4 mos on and off. Today it started at 1800. Pt states that he has been having stress seizures for 2 years. Pt states that he feels better when he hold his chest. Pt states that pain is to L sternum, almost mid axillary. Admits to alcohol, cigarette and marijuana use.

## 2020-12-22 LAB — TROPONIN I (HIGH SENSITIVITY): Troponin I (High Sensitivity): 2 ng/L (ref <18)

## 2020-12-22 MED ORDER — PANTOPRAZOLE SODIUM 20 MG PO TBEC: 20.0000 mg | DELAYED_RELEASE_TABLET | Freq: Every day | ORAL | 0 refills | Status: DC

## 2020-12-22 NOTE — ED Provider Notes (Signed)
[facility] Provider Note   CSN: [id number] Arrival date & time: 12/21/20  2002     History Chief Complaint  Patient presents with   Chest Pain    Todd Yoder is a 35 y.o. male.  Patient is a 35 year old male who presents with chest pain.  He has a history of bipolar disorder, GERD and seizures.  He says he has been under a lot of stress recently.  He has been having chest pain for the last 4 months.  He describes it as a daily pain that usually last for hours.  He says he can draw line down around his center of his chest and his left side.  Otherwise nonradiating.  No pleuritic symptoms.  No exertional symptoms.  He says when the chest pain is air typically he feels like his heart is racing although not always.  He also gets flushed and feels nauseated.  He says sometimes it is worse after he eats spicy foods and sometimes it is worse after stress.  No leg pain or swelling.  He does smoke cigarettes.  No history of hypertension, hyperlipidemia or diabetes.  His grandmother had a heart attack in her mid to late 28s but no other known family history of early heart disease.  No leg pain or swelling.      Past Medical History:  Diagnosis Date   Bipolar disorder [facility])    Family history of adverse reaction to anesthesia    problem with aunt waking up after surgery   GERD (gastroesophageal reflux disease)    Seizures (HCC)     Patient Active Problem List   Diagnosis Date Noted   Seizures (HCC) 11/25/2018    Past Surgical History:  Procedure Laterality Date   KNEE ARTHROSCOPY WITH MENISCAL REPAIR Right 09/19/2017   Procedure: KNEE ARTHROSCOPY WITH MENISCAL REPAIR;  Surgeon: Sheral Apley, MD;  Location: [facility];  Service: Orthopedics;  Laterality: Right;   TOOTH EXTRACTION         Family History  Problem Relation Age of Onset   Diabetes Mother    Hypertension Mother    Sarcoidosis Mother    Hypertension Father      Social History   Tobacco Use   Smoking status: Former    Packs/day: 0.25    Pack years: 0.00    Types: Cigarettes   Smokeless tobacco: Never   Tobacco comments:    smokes cigarettes occasionally  Vaping Use   Vaping Use: Former  Substance Use Topics   Alcohol use: Yes    Comment: occasional   Drug use: Yes    Types: Marijuana    Home Medications Prior to Admission medications   Medication Sig Start Date End Date Taking? Authorizing Provider  pantoprazole (PROTONIX) 20 MG tablet Take 1 tablet (20 mg total) by mouth daily. 12/22/20  Yes Rolan Bucco, MD  Ascorbic Acid (VITAMIN C PO) Take 3 tablets by mouth daily.    [provider]  divalproex (DEPAKOTE ER) 500 MG 24 hr tablet Take 1 tab in AM, 3 tabs in PM 04/03/20   Van Clines, MD  guaiFENesin (MUCINEX) 600 MG 12 hr tablet Take 600 mg by mouth 2 (two) times daily as needed (Congestion).    [provider]  naproxen sodium (ALEVE) 220 MG tablet Take 440 mg by mouth daily as needed (pain).    [provider]  Pseudoeph-Doxylamine-DM-APAP (NYQUIL PO) Take 1 Dose by mouth daily as needed (Itchy  Throat).    [provider]  SUMAtriptan (IMITREX) 50 MG tablet Take 1 tablet (50 mg total) by mouth every 2 (two) hours as needed for migraine. May repeat in 2 hours if headache persists or recurs. 04/03/20   Van Clines, MD    Allergies    Tramadol, Augmentin [amoxicillin-pot clavulanate], Nickel, and Silver  Review of Systems   Review of Systems  Constitutional:  Negative for chills, diaphoresis, fatigue and fever.  HENT:  Negative for congestion, rhinorrhea and sneezing.   Eyes: Negative.   Respiratory:  Positive for shortness of breath. Negative for cough and chest tightness.   Cardiovascular:  Positive for chest pain and palpitations. Negative for leg swelling.  Gastrointestinal:  Positive for nausea. Negative for abdominal pain, blood in stool, diarrhea and vomiting.  Genitourinary:   Negative for difficulty urinating, flank pain, frequency and hematuria.  Musculoskeletal:  Negative for arthralgias and back pain.  Skin:  Negative for rash.  Neurological:  Negative for dizziness, speech difficulty, weakness, numbness and headaches.   Physical Exam Updated Vital Signs BP 130/82   Pulse 62   Temp 97.8 F (36.6 C) (Oral)   Resp 13   Ht 6\' 5"  (1.956 m)   Wt 117.9 kg   SpO2 97%   BMI 30.83 kg/m   Physical Exam Constitutional:      Appearance: He is well-developed.  HENT:     Head: Normocephalic and atraumatic.  Eyes:     Pupils: Pupils are equal, round, and reactive to light.  Cardiovascular:     Rate and Rhythm: Normal rate and regular rhythm.     Heart sounds: Normal heart sounds.  Pulmonary:     Effort: Pulmonary effort is normal. No respiratory distress.     Breath sounds: Normal breath sounds. No wheezing or rales.  Chest:     Chest wall: No tenderness.  Abdominal:     General: Bowel sounds are normal.     Palpations: Abdomen is soft.     Tenderness: There is no abdominal tenderness. There is no guarding or rebound.  Musculoskeletal:        General: Normal range of motion.     Cervical back: Normal range of motion and neck supple.     Comments: No edema or calf tenderness  Lymphadenopathy:     Cervical: No cervical adenopathy.  Skin:    General: Skin is warm and dry.     Findings: No rash.  Neurological:     Mental Status: He is alert and oriented to person, place, and time.    ED Results / Procedures / Treatments   Labs (all labs ordered are listed, but only abnormal results are displayed) Labs Reviewed  BASIC METABOLIC PANEL  CBC  TROPONIN I (HIGH SENSITIVITY)  TROPONIN I (HIGH SENSITIVITY)    EKG EKG Interpretation  Date/Time:  Monday December 21 2020 21:01:50 EDT Ventricular Rate:  83 PR Interval:  148 QRS Duration: 83 QT Interval:  336 QTC Calculation: 395 R Axis:   32 Text Interpretation: Sinus rhythm 12 Lead; Mason-Likar  since last tracing no significant change Confirmed by [birth year] [phone]) on 12/22/2020 4:57:55 AM  Radiology DG Chest Port 1 View  Result Date: 12/21/2020 CLINICAL DATA:  Chest pain and palpitations for 4 months. Seizures for 2 years. Former smoker. EXAM: PORTABLE CHEST 1 VIEW COMPARISON:  09/05/2018 FINDINGS: The heart size and mediastinal contours are within normal limits. Both lungs are clear. The visualized skeletal structures are unremarkable. IMPRESSION: No  active disease. Electronically Signed   By: Burman Nieves M.D.   On: 12/21/2020 21:47    Procedures Procedures   Medications Ordered in ED Medications - No data to display  ED Course  I have reviewed the triage vital signs and the nursing notes.  Pertinent labs & imaging results that were available during my care of the patient were reviewed by me and considered in my medical decision making (see chart for details).    MDM Rules/Calculators/A&P                          Patient is a 35 year old male who presents with chest pain.  Is been fairly constant for 4 months.  He does have some palpitations associated with it but not consistently.  His EKG shows a sinus rhythm with no ischemic changes.  Chest x-ray is clear without evidence of pneumonia or pneumothorax.  He does not have other symptoms that sound more suggestive of aortic dissection or PE.  His symptoms do go away at times but then come back.  The intermittent nature would go against these other etiologies.  He has no exertional symptoms.  He has had 2 negative troponins.  He was discharged home in good condition.  He says sometimes it is related to eating.  I will start him on Protonix and advised him to eat a bland diet.  He was given a referral to follow-up with cardiology.  Return precautions were given. Final Clinical Impression(s) / ED Diagnoses Final diagnoses:  Nonspecific chest pain    Rx / DC Orders ED Discharge Orders          Ordered    pantoprazole  (PROTONIX) 20 MG tablet  Daily        12/22/20 7829             Rolan Bucco, MD 12/22/20 [phone]

## 2020-12-22 NOTE — ED Notes (Signed)
Pt verbalized understanding of d/c, medication, and follow up care. Ambulatory with steady gait.  

## 2020-12-28 ENCOUNTER — Telehealth: Payer: Self-pay | Admitting: Neurology

## 2020-12-28 NOTE — Telephone Encounter (Signed)
Patient advised and to follow up as scheduled

## 2020-12-28 NOTE — Telephone Encounter (Signed)
Todd Yoder said he would like a call back to see if Todd Yoder would fit him in sooner than November. He cant seem to get his anxiety under control. He said he has been taking his mothers meds just to try and help him. He knows he isn't supposed to but he needs something to calm him. His heart is constantly racing, with crying spells. He has been sent home from work and it is interfering with his daily life.

## 2020-12-28 NOTE — Telephone Encounter (Signed)
Please see .thanks.

## 2020-12-28 NOTE — Telephone Encounter (Signed)
Pls let him know that he needs to see his PCP or psychiatry for anxiety. If he does not have one, pls give him [place] or [facility] info, he can do a walk in to [facility]. Thanks

## 2021-01-13 ENCOUNTER — Ambulatory Visit (HOSPITAL_COMMUNITY)
Admission: EM | Admit: 2021-01-13 | Discharge: 2021-01-13 | Disposition: A | Discharge status: Home / Self Care | Payer: BC Managed Care – PPO

## 2021-01-13 ENCOUNTER — Other Ambulatory Visit: Payer: Self-pay

## 2021-01-13 ENCOUNTER — Telehealth: Payer: Self-pay | Admitting: Neurology

## 2021-01-13 ENCOUNTER — Encounter (HOSPITAL_COMMUNITY): Payer: Self-pay

## 2021-01-13 MED ORDER — DIVALPROEX SODIUM ER 500 MG PO TB24: ORAL_TABLET | ORAL | 3 refills | Status: DC

## 2021-01-13 NOTE — Telephone Encounter (Signed)
Patient called and left a message requesting a letter to excuse him from work today.   He said he doesn't feel like he can drive today and his employer thinks he's faking it. Patient has seizures.  Patient requested to be seen sooner as well and has been added to the wait list for Dr. Karel Jarvis.

## 2021-01-13 NOTE — Telephone Encounter (Signed)
Pt called to increase Depakote 500mg : Take 2 tabs in AM, 3 tabs in PM. New script called into pharmacy

## 2021-01-13 NOTE — Telephone Encounter (Signed)
Pls have him increase Depakote 500mg : Take 2 tabs in AM, 3 tabs in PM. Pls send in updated Rx, thanks

## 2021-01-13 NOTE — Addendum Note (Signed)
Addended by: Dimas Chyle on: 01/13/2021 02:12 PM   Modules accepted: Orders

## 2021-01-13 NOTE — Telephone Encounter (Signed)
Pt c/o: seizure Missed medications?  Yes.   Missed one dose a week ago  Sleep deprived?  Yes.    The last 3 weeks only 4 hours sleep a night not sure why he cant sleep tries to nap on days off will take a Nyquil some to help but doesn't really help  Alcohol intake?  Yes.   Here and there. Sips of wine if anything,  Dose smoke 2 a day Back to their usual baseline self?  Yes.  .  Just hurts a little called out of work because he hurts. Shoulders are hurting,  Trying to keep stress level down. Doing 8 hour shifts at work, Current medications prescribed by Dr. Karel Jarvis: Depakote  Take 2 tab in AM, 2 tabs in PM  Happing in his sleep, he isn't breathing well after seizure. Pt is scared he might not wake up,

## 2021-01-13 NOTE — ED Triage Notes (Signed)
Pt reports his mom told him, he had 3 episodes of grand mal seizure around 11 am today. Pt reports having headache and he needs proof to his job that he has seizures.

## 2021-01-14 NOTE — Telephone Encounter (Signed)
Pt called informed that Dr Karel Jarvis is out of the office this afternoon I will call him back when I find out about his letter, I also told him NO driving until 6 months event free per [place] driving law because he puts his self and others in danger when he drives,

## 2021-01-14 NOTE — Telephone Encounter (Signed)
Pt called again inquiring about a doctor note for yesterday and today. He said he almost had a wreck due to his arms locking up. His arms are in severe pain, he had 3 seizures. Basically he said his arms like dead weight. His jobs keeps giving him a hard time, that's why he would like help with a doctor note.

## 2021-01-14 NOTE — Telephone Encounter (Signed)
Patient called to check on the status of the letter.

## 2021-01-15 NOTE — Telephone Encounter (Signed)
I have an opening on July 12 Tues at 2:30pm for f/u, can he come then. Also, what dates does he need for his letter? Thanks

## 2021-01-18 NOTE — Telephone Encounter (Signed)
When patient calls back, I have an opening tomorrow at 2:30pm and on Wed at 2pm, can offer those slots, thanks

## 2021-01-18 NOTE — Telephone Encounter (Signed)
Left message to call office at [phone] 01/18/2021

## 2021-01-20 ENCOUNTER — Other Ambulatory Visit: Payer: Self-pay

## 2021-01-20 ENCOUNTER — Encounter: Payer: Self-pay | Admitting: Neurology

## 2021-01-20 ENCOUNTER — Ambulatory Visit (INDEPENDENT_AMBULATORY_CARE_PROVIDER_SITE_OTHER): Payer: BC Managed Care – PPO | Admitting: Neurology

## 2021-01-20 VITALS — BP 132/78 | HR 82 | Ht 77.0 in | Wt 251.2 lb

## 2021-01-20 DIAGNOSIS — G40009 Localization-related (focal) (partial) idiopathic epilepsy and epileptic syndromes with seizures of localized onset, not intractable, without status epilepticus: Secondary | ICD-10-CM

## 2021-01-20 MED ORDER — DIVALPROEX SODIUM ER 500 MG PO TB24: ORAL_TABLET | ORAL | 11 refills | Status: DC

## 2021-01-20 NOTE — Patient Instructions (Signed)
Continue Depakote 500mg : take 2 tablets in AM, 3 tablets in PM  2. Start melatonin to help with sleep. Can start with 3mg  for a week, then if no change, increase to 5mg . Can go up to 10mg  every night  3. Follow-up in 3-4 months, call for any changes   Seizure Precautions: 1. If medication has been prescribed for you to prevent seizures, take it exactly as directed.  Do not stop taking the medicine without talking to your doctor first, even if you have not had a seizure in a long time.   2. Avoid activities in which a seizure would cause danger to yourself or to others.  Don't operate dangerous machinery, swim alone, or climb in high or dangerous places, such as on ladders, roofs, or girders.  Do not drive unless your doctor says you may.  3. If you have any warning that you may have a seizure, lay down in a safe place where you can't hurt yourself.    4.  No driving for 6 months from last seizure, as per [facility].   Please refer to the following link on the [employer]'s website for more information: http://www.epilepsyfoundation.org/answerplace/Social/driving/drivingu.cfm   5.  Maintain good sleep hygiene. Avoid alcohol.  6.  Contact your doctor if you have any problems that may be related to the medicine you are taking.  7.  Call 911 and bring the patient back to the ED if:        A.  The seizure lasts longer than 5 minutes.       B.  The patient doesn't awaken shortly after the seizure  C.  The patient has new problems such as difficulty seeing, speaking or moving  D.  The patient was injured during the seizure  E.  The patient has a temperature over 102 F (39C)  F.  The patient vomited and now is having trouble breathing

## 2021-01-20 NOTE — Progress Notes (Signed)
NEUROLOGY FOLLOW UP OFFICE NOTE  Todd Yoder [id number] 05/20/1986  HISTORY OF PRESENT ILLNESS: I had the pleasure of seeing Todd Yoder in follow-up in the neurology clinic on 01/20/2021.  The patient was last seen a year ago for seizures. He is alone in the office today. Records and images were personally reviewed where available. He had a 48-hour EEG in 02/2020 which showed frequent right anterior temporal epileptiform discharges exclusively in sleep. There were 3 electrographic seizures captured arising from the right temporal region lasting 70 seconds to 2.5 minutes with no clinical symptoms reported. Depakote increased to 500mg  in AM, 1500mg  in PM. He was lost to follow-up and reports he had been seizure-free for 7 months (3 seizures around Christmas) until 12/28/20 when he called to report a seizure in the setting of sleep deprivation and some alcohol. He was instructed to increase Depakote to 1000mg  in AM, 1500mg  in PM but was unable to do this until he had 3 seizures in one day last 01/13/21. His mother and brother witnessed the seizures, he bit his mouth and tongue and fell against the cooler. He was told he had a hard time breathing after convulsing. He had bilateral shoulder pain and feels like they just pop out of place. He was driving and his arms were locking up in severe pain. He is able to lift arms above his head but there is a jarring movement when he turns his arms. He has not been getting a lot of sleep and plans to start melatonin. He had been working over 40 hours a week but now will be back to prn. He will be seeing his new PCP next week.   History on Initial Assessment 12/18/2018: This is a very pleasant 35 year old right-handed man with a history of bipolar disorder presenting for evaluation of new onset seizures. The first seizure occurred in his sleep on 10/04/2018. He had been sleep deprived for 1-2 days with only 4 hours of sleep, he recalled feeling tired and had a little  headache then woke up inside the ambulance. His had bit his lip/tongue. He was brought to [facility] where CBC, BMP, head CT without contrast were unremarkable except for slight cerebellar tonsillar ectopia. UDS positive for THC. He had another seizure on 11/25/2018 again in his sleep. 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 He got off work at 10/06/2018 and started moving his things into storage. He had a little nip of scotch and brandy, lay down, then woke up to family pushing him to go to the hospital. If felt like his muscles had a rigorous workout. He was brought to [facility] and as he was getting an MRI, he recalls hyperventilating when his head was put in the helmet vise, then he woke up 5 hours later with shoulder pain R>L. He was reportedly foaming at the mouth with tongue bite, with a convulsive seizure lasting a couple of minutes. He had an MRI brain without contrast which I personally reviewed, no acute changes, hippocampi symmetric with no abnormal signal. 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 His wake and sleep EEG was normal. He was discharged home on Keppra but stopped it after a week because it was making him hostile. His mother takes Depakote so he had been taking his mother's Depakote 500mg  BID since then and denies any further  convulsions.  He reports recurrent symptoms since 2018 when he started having panic attacks out of nowhere. He would start sweating and fell weak, like he would fall. Standing in front of a fan seemed to help, it would last 5-6 minutes then recur for several times. He has also been having a sensation running up and down his chest and back, an uncontrollable cool sensation that has quieted down some since starting medication. He recalls an episode at age 37 when he was driving to [facility] and it felt like his whole body paused, then  he snapped back with the car still in motion. He attributed this to smoking marijuana. He denies any staring/unresponsive episodes, gaps in time, olfactory/gustatory hallucinations, focal numbness/tingling/weakness, myoclonic jerks. Since Sunday, he would start having a pounding headache right before he takes his dose of Depakote. He denies any dizziness, diplopia, neck/back pain. No side effects on Depakote.   Epilepsy Risk Factors:  His mother, maternal aunt, and maternal cousin have seizures. Otherwise he had a normal birth and early development.  There is no history of febrile convulsions, CNS infections such as meningitis/encephalitis, significant traumatic brain injury, neurosurgical procedures.  Diagnostic Data: MRI brain without contrast in 11/2018 showed no acute changes, hippocampi symmetric with no abnormal signal. 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  His wake and sleep EEG in 11/2018 was normal. 48-hour EEG in 02/2020 showed frequent right anterior temporal epileptiform discharges exclusively in sleep. There were 3 electrographic seizures captured arising from the right temporal region lasting 70 seconds to 2.5 minutes with no clinical symptoms reported.  PAST MEDICAL HISTORY: Past Medical History:  Diagnosis Date   Bipolar disorder (HCC)    Family history of adverse reaction to anesthesia    problem with aunt waking up after surgery   GERD (gastroesophageal reflux disease)    Seizures (HCC)     MEDICATIONS: Current Outpatient Medications on File Prior to Visit  Medication Sig Dispense Refill   Ascorbic Acid (VITAMIN C PO) Take 3 tablets by mouth daily.     divalproex (DEPAKOTE ER) 500 MG 24 hr tablet Take 2 tabs in AM, 3 tabs in PM 150 tablet 3   naproxen sodium (ALEVE) 220 MG tablet Take 440 mg by mouth daily as needed (pain).     pantoprazole (PROTONIX) 20 MG tablet  Take 1 tablet (20 mg total) by mouth daily. 30 tablet 0   Pseudoeph-Doxylamine-DM-APAP (NYQUIL PO) Take 1 Dose by mouth daily as needed (Itchy Throat).     SUMAtriptan (IMITREX) 50 MG tablet Take 1 tablet (50 mg total) by mouth every 2 (two) hours as needed for migraine. May repeat in 2 hours if headache persists or recurs. 10 tablet 3   guaiFENesin (MUCINEX) 600 MG 12 hr tablet Take 600 mg by mouth 2 (two) times daily as needed (Congestion). (Patient not taking: Reported on 01/20/2021)     No current facility-administered medications on file prior to visit.    ALLERGIES: Allergies  Allergen Reactions   Tramadol Nausea And Vomiting   Augmentin [Amoxicillin-Pot Clavulanate] Rash and Other (See Comments)    Has patient had a PCN reaction causing immediate rash, facial/tongue/throat swelling, SOB or lightheadedness with hypotension: No Has patient had a PCN reaction causing severe rash involving mucus membranes or skin necrosis: Yes Has patient had a PCN reaction that required hospitalization Yes Has patient had a PCN reaction occurring within the  last 10 years: No If all of the above answers are "NO", then may proceed with Cephalosporin use.    Nickel Rash    Burning    Silver Rash    Burning     FAMILY HISTORY: Family History  Problem Relation Age of Onset   Diabetes Mother    Hypertension Mother    Sarcoidosis Mother    Hypertension Father     SOCIAL HISTORY: Social History   Socioeconomic History   Marital status: Single    Spouse name: Not on file   Number of children: Not on file   Years of education: Not on file   Highest education level: Not on file  Occupational History   Occupation: CNA  Tobacco Use   Smoking status: Some Days    Packs/day: 0.25    Pack years: 0.00    Types: Cigarettes   Smokeless tobacco: Never   Tobacco comments:    smokes cigarettes occasionally  Vaping Use   Vaping Use: Former  Substance and Sexual Activity   Alcohol use: Yes     Comment: occasional   Drug use: Yes    Types: Marijuana   Sexual activity: Yes  Other Topics Concern   Not on file  Social History Narrative   Right handed    One level home   Highest level of edu- some college   Social Determinants of Health   Financial Resource Strain: Not on file  Food Insecurity: Not on file  Transportation Needs: Not on file  Physical Activity: Not on file  Stress: Not on file  Social Connections: Not on file  Intimate Partner Violence: Not on file     PHYSICAL EXAM: Vitals:   01/20/21 1353  BP: 132/78  Pulse: 82  SpO2: 97%   General: No acute distress Head:  Normocephalic/atraumatic Skin/Extremities: No rash, no edema Neurological Exam: alert and awake. No aphasia or dysarthria. Fund of knowledge is appropriate.  Recent and remote memory are intact.  Attention and concentration are normal.   Cranial nerves: Pupils equal, round. Extraocular movements intact with no nystagmus. Visual fields full.  No facial asymmetry.  Motor: Bulk and tone normal, muscle strength 5/5 throughout with no pronator drift.   Finger to nose testing intact.  Gait narrow-based and steady, able to tandem walk adequately.  Romberg negative. No tremor.   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 MRI brain unremarkable. His 48-hour EEG showed frequent right anterior temporal epileptiform discharges exclusively in sleep with 3 electrographic seizures captured arising from the right temporal region lasting 70 seconds to 2.5 minutes with no clinical symptoms reported. He had 3 seizures in one day last 12/15/20, increased Depakote to 1000mg  in AM, 1500mg  in PM. We discussed avoidance of seizure triggers, including missing medication, alcohol, sleep deprivation. He will try melatonin to help with sleep. If seizures recur without triggers, would add on oxcarbazepine. He reports feeling like his shoulder dislocates, he  is able to self-reduce by himself. Follow-up with PCP. Discussed [place] driving laws, no driving until 6 months seizure-free. Follow-up in 3 months, call for any changes.   Thank you for allowing me to participate in his care.  Please do not hesitate to call for any questions or concerns.   , M.D.   CC: Dr. 

## 2021-01-27 ENCOUNTER — Encounter: Payer: Self-pay | Admitting: Nurse Practitioner

## 2021-01-27 ENCOUNTER — Other Ambulatory Visit: Payer: Self-pay

## 2021-01-27 ENCOUNTER — Ambulatory Visit (INDEPENDENT_AMBULATORY_CARE_PROVIDER_SITE_OTHER): Payer: BC Managed Care – PPO | Admitting: Nurse Practitioner

## 2021-01-27 VITALS — BP 128/72 | HR 82 | Temp 98.0°F | Ht 74.6 in | Wt 252.2 lb

## 2021-01-27 DIAGNOSIS — F419 Anxiety disorder, unspecified: Secondary | ICD-10-CM | POA: Diagnosis not present

## 2021-01-27 DIAGNOSIS — M5489 Other dorsalgia: Secondary | ICD-10-CM | POA: Diagnosis not present

## 2021-01-27 DIAGNOSIS — M549 Dorsalgia, unspecified: Secondary | ICD-10-CM

## 2021-01-27 DIAGNOSIS — E66811 Obesity, class 1: Secondary | ICD-10-CM

## 2021-01-27 DIAGNOSIS — Z7689 Persons encountering health services in other specified circumstances: Secondary | ICD-10-CM

## 2021-01-27 DIAGNOSIS — Z6831 Body mass index (BMI) 31.0-31.9, adult: Secondary | ICD-10-CM

## 2021-01-27 DIAGNOSIS — K219 Gastro-esophageal reflux disease without esophagitis: Secondary | ICD-10-CM

## 2021-01-27 DIAGNOSIS — E6609 Other obesity due to excess calories: Secondary | ICD-10-CM | POA: Diagnosis not present

## 2021-01-27 MED ORDER — PREDNISONE 10 MG PO TABS: ORAL_TABLET | ORAL | 0 refills | Status: DC

## 2021-01-27 NOTE — Patient Instructions (Signed)
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
## 2021-03-04 ENCOUNTER — Encounter: Payer: BC Managed Care – PPO | Admitting: Nurse Practitioner

## 2021-05-10 ENCOUNTER — Ambulatory Visit: Payer: Self-pay | Admitting: Neurology

## 2021-05-13 ENCOUNTER — Ambulatory Visit: Payer: BC Managed Care – PPO | Admitting: Neurology

## 2021-05-28 ENCOUNTER — Ambulatory Visit: Payer: BC Managed Care – PPO | Admitting: Neurology

## 2021-12-26 ENCOUNTER — Ambulatory Visit
Admission: EM | Admit: 2021-12-26 | Discharge: 2021-12-26 | Disposition: A | Discharge status: Home / Self Care | Payer: Self-pay | Attending: Internal Medicine | Admitting: Internal Medicine

## 2021-12-26 DIAGNOSIS — S91332A Puncture wound without foreign body, left foot, initial encounter: Secondary | ICD-10-CM

## 2021-12-26 MED ORDER — CIPROFLOXACIN HCL 500 MG PO TABS: 500.0000 mg | ORAL_TABLET | Freq: Two times a day (BID) | ORAL | 0 refills | Status: DC

## 2021-12-26 MED ORDER — TETANUS-DIPHTH-ACELL PERTUSSIS 5-2.5-18.5 LF-MCG/0.5 IM SUSY
0.5000 mL | PREFILLED_SYRINGE | Freq: Once | INTRAMUSCULAR | Status: AC
  Administered 2021-12-26: 0.5 mL via INTRAMUSCULAR

## 2021-12-26 NOTE — Discharge Instructions (Addendum)
You were given a tetanus shot today in urgent care for puncture wound.  You have also been prescribed an antibiotic to take to prevent infection.  Please keep area clean and dry.

## 2021-12-26 NOTE — ED Triage Notes (Signed)
Pt c/o stepping on nail today concerned for tetanus or infection.

## 2021-12-26 NOTE — ED Provider Notes (Addendum)
[facility]    CSN: [id number] Arrival date & time: 12/26/21  1202      History   Chief Complaint Chief Complaint  Patient presents with   step on nail    HPI Todd Yoder is a 36 y.o. male.   Patient presents after stepping on a nail today a few hours prior to arrival to urgent care.  Patient reports that they were wearing boots and was trying to catch their dog when they stepped on a shed that had fallen down and subsequently stepped on a nail.  Patient is not sure the size of the nail or how far it went in.  Patient has full range of motion of foot.  Nail punctured left lateral aspect of foot.  Denies any numbness or tingling.  Patient is not sure of last tetanus vaccine.     Past Medical History:  Diagnosis Date   Bipolar disorder [facility])    Family history of adverse reaction to anesthesia    problem with aunt waking up after surgery   GERD (gastroesophageal reflux disease)    Seizures (HCC)     Patient Active Problem List   Diagnosis Date Noted   Seizures (HCC) 11/25/2018    Past Surgical History:  Procedure Laterality Date   KNEE ARTHROSCOPY WITH MENISCAL REPAIR Right 09/19/2017   Procedure: KNEE ARTHROSCOPY WITH MENISCAL REPAIR;  Surgeon: Sheral Apley, MD;  Location: [facility];  Service: Orthopedics;  Laterality: Right;   TOOTH EXTRACTION         Home Medications    Prior to Admission medications   Medication Sig Start Date End Date Taking? Authorizing Provider  ciprofloxacin (CIPRO) 500 MG tablet Take 1 tablet (500 mg total) by mouth every 12 (twelve) hours. 12/26/21  Yes Mallory Enriques, Rolly Salter E, FNP  Ascorbic Acid (VITAMIN C PO) Take 3 tablets by mouth daily.    [provider]  divalproex (DEPAKOTE ER) 500 MG 24 hr tablet Take 2 tabs in AM, 3 tabs in PM 01/20/21   Van Clines, MD  guaiFENesin (MUCINEX) 600 MG 12 hr tablet Take 600 mg by mouth 2 (two) times daily as needed (Congestion).    [provider]   naproxen sodium (ALEVE) 220 MG tablet Take 440 mg by mouth daily as needed (pain).    [provider]  pantoprazole (PROTONIX) 20 MG tablet Take 1 tablet (20 mg total) by mouth daily. 12/22/20   Rolan Bucco, MD  predniSONE (DELTASONE) 10 MG tablet Take as directed. 01/27/21   Ghumman, Ramandeep, NP  Pseudoeph-Doxylamine-DM-APAP (NYQUIL PO) Take 1 Dose by mouth daily as needed (Itchy Throat).    [provider]  SUMAtriptan (IMITREX) 50 MG tablet Take 1 tablet (50 mg total) by mouth every 2 (two) hours as needed for migraine. May repeat in 2 hours if headache persists or recurs. 04/03/20   Van Clines, MD    Family History Family History  Problem Relation Age of Onset   Diabetes Mother    Hypertension Mother    Sarcoidosis Mother    Hypertension Father    Diabetes Maternal Grandfather    Hypertension Paternal Grandfather    Atrial fibrillation Paternal Grandfather    Parkinson's disease Paternal Grandfather     Social History Social History   Tobacco Use   Smoking status: Former    Packs/day: 0.25    Types: Cigarettes    Quit date: 01/24/2021    Years since quitting: 0.9   Smokeless  tobacco: Never   Tobacco comments:    smokes cigarettes occasionally  Vaping Use   Vaping Use: Former  Substance Use Topics   Alcohol use: Not Currently    Comment: occasional   Drug use: Never     Allergies   Tramadol, Augmentin [amoxicillin-pot clavulanate], Nickel, and Silver   Review of Systems Review of Systems Per HPI  Physical Exam Triage Vital Signs ED Triage Vitals [12/26/21 1229]  Enc Vitals Group     BP (!) 146/85     Pulse Rate 65     Resp 18     Temp 98 F (36.7 C)     Temp Source Oral     SpO2 98 %     Weight      Height      Head Circumference      Peak Flow      Pain Score 0     Pain Loc      Pain Edu?      Excl. in GC?    No data found.  Updated Vital Signs BP (!) 146/85 (BP Location: Right Arm)   Pulse 65   Temp 98 F (36.7  C) (Oral)   Resp 18   SpO2 98%   Visual Acuity Right Eye Distance:   Left Eye Distance:   Bilateral Distance:    Right Eye Near:   Left Eye Near:    Bilateral Near:     Physical Exam Constitutional:      General: He is not in acute distress.    Appearance: Normal appearance. He is not toxic-appearing or diaphoretic.  HENT:     Head: Normocephalic and atraumatic.  Eyes:     Extraocular Movements: Extraocular movements intact.     Conjunctiva/sclera: Conjunctivae normal.  Pulmonary:     Effort: Pulmonary effort is normal.  Skin:    Comments: Puncture wound to plantar surface of left foot directly beneath left fifth toe.  Mild swelling noted.  No obvious purulent drainage or discoloration noted.  Patient has full range of motion of toes and foot.  Capillary refill and pulses normal.  Neurological:     General: No focal deficit present.     Mental Status: He is alert and oriented to person, place, and time. Mental status is at baseline.  Psychiatric:        Mood and Affect: Mood normal.        Behavior: Behavior normal.        Thought Content: Thought content normal.        Judgment: Judgment normal.      UC Treatments / Results  Labs (all labs ordered are listed, but only abnormal results are displayed) Labs Reviewed - No data to display  EKG   Radiology No results found.  Procedures Procedures (including critical care time)  Medications Ordered in UC Medications  Tdap (BOOSTRIX) injection 0.5 mL (0.5 mLs Intramuscular Given 12/26/21 1251)    Initial Impression / Assessment and Plan / UC Course  I have reviewed the triage vital signs and the nursing notes.  Pertinent labs & imaging results that were available during my care of the patient were reviewed by me and considered in my medical decision making (see chart for details).     Tetanus vaccine updated.  Will treat with Cipro given that puncture wound was through rubber boot.  Wound was cleaned and  nonadherent dressing with antibiotic ointment was applied prior to patient leaving.  Patient was advised to  keep area clean and to monitor for signs of infection.  Patient was also advised to avoid any strenuous physical activity while on this antibiotic.  Patient was given strict return precautions.  Patient verbalized understanding and was agreeable with plan. Final Clinical Impressions(s) / UC Diagnoses   Final diagnoses:  Puncture wound of plantar aspect of left foot, initial encounter     Discharge Instructions      You were given a tetanus shot today in urgent care for puncture wound.  You have also been prescribed an antibiotic to take to prevent infection.  Please keep area clean and dry.    ED Prescriptions     Medication Sig Dispense Auth. Provider   ciprofloxacin (CIPRO) 500 MG tablet Take 1 tablet (500 mg total) by mouth every 12 (twelve) hours. 10 tablet Gustavus Bryant, [place]      PDMP not reviewed this encounter.   Gustavus Bryant, [place] 12/26/21 1323    Gustavus Bryant, [place] 12/26/21 1324

## 2022-01-06 ENCOUNTER — Telehealth: Payer: Self-pay | Admitting: Neurology

## 2022-01-06 MED ORDER — DIVALPROEX SODIUM ER 500 MG PO TB24: ORAL_TABLET | ORAL | 0 refills | Status: DC

## 2022-01-06 NOTE — Addendum Note (Signed)
Addended by: Dimas Chyle on: 01/06/2022 11:20 AM   Modules accepted: Orders

## 2022-01-06 NOTE — Telephone Encounter (Signed)
Pls let him know that we will send refills until his appointment, but we have a no-show policy that we would no longer be able to see him if he misses his July appt, pls keep appt. thanks

## 2022-01-06 NOTE — Telephone Encounter (Signed)
Pt called no answer left a voice mail to call the office back  °

## 2022-01-06 NOTE — Telephone Encounter (Signed)
Pt called no answer left a voice mail to call the office back. Refill sent or one month. When pt calls back we need to let him know that refill was sent , but we have a no-show policy that we would no longer be able to see him if he misses his July appt, pls keep appt.

## 2022-01-06 NOTE — Telephone Encounter (Signed)
Patient called upset due to him having no medicine. He was very agitated, stated we are punishing him by not giving him his medication. He wants to know why he cant get medicine (even though I let him know why) said aquino was the one that restricted him from everything. Jonette Mate knows he doesn't have a PCP. I told him he needs to find one, and it will hep him out. He said I will be there in July and ended call

## 2022-01-07 NOTE — Telephone Encounter (Signed)
Pt called informed that  Refill sent or one month. When pt calls back we need to let him know that refill was sent , but we have a no-show policy that we would no longer be able to see him if he misses his July appt, pls keep appt. Pt stated he has taken that day off work

## 2022-01-28 ENCOUNTER — Ambulatory Visit: Payer: Self-pay | Admitting: Neurology

## 2022-01-28 ENCOUNTER — Encounter: Payer: Self-pay | Admitting: Neurology

## 2022-01-28 ENCOUNTER — Ambulatory Visit (INDEPENDENT_AMBULATORY_CARE_PROVIDER_SITE_OTHER): Payer: Self-pay | Admitting: Neurology

## 2022-01-28 VITALS — BP 115/76 | HR 70 | Ht 77.0 in | Wt 220.0 lb

## 2022-01-28 DIAGNOSIS — G43009 Migraine without aura, not intractable, without status migrainosus: Secondary | ICD-10-CM

## 2022-01-28 DIAGNOSIS — G40009 Localization-related (focal) (partial) idiopathic epilepsy and epileptic syndromes with seizures of localized onset, not intractable, without status epilepticus: Secondary | ICD-10-CM

## 2022-01-28 MED ORDER — SUMATRIPTAN SUCCINATE 50 MG PO TABS: 50.0000 mg | ORAL_TABLET | ORAL | 11 refills | Status: DC | PRN

## 2022-01-28 MED ORDER — DIVALPROEX SODIUM ER 500 MG PO TB24: ORAL_TABLET | ORAL | 3 refills | Status: DC

## 2022-01-28 NOTE — Progress Notes (Signed)
NEUROLOGY FOLLOW UP OFFICE NOTE  Todd Yoder [id number] 1985/10/20  HISTORY OF PRESENT ILLNESS: I had the pleasure of seeing Todd Yoder in follow-up in the neurology clinic on 01/28/2022.  The patient was last seen a year ago for seizures. He is alone in the office today. Since his last visit, he reports that he had 3 nocturnal seizures in one night in December, then 3 nocturnal seizures one day mid-January. He recalls that for 3-4 days after, he kept having falls with his legs just going out from under him. There wasno associated seizure or myoclonic jerk, he would stand and just fall. This has resolved. He had worked 6 days straight and was spreading out his medications due to refill issues. There was also a lot of stress at home. Since taking Depakote 500mg  2 tabs in AM, 3 tabs in PM regularly, he denies any further seizures. He had one blackout episode in February while smoking weed, a similar episode happened years ago under the same circumstances, there was no warning, no convulsive activity. He came to pretty quickly with no confusion, he just had a throbbing headache. He is still having headaches but notes the Depakote does help. He has prn sumatriptan that he takes on a rare occasion. He has noticed his eyes move side to side when he is looking to one side or focusing, it is brief. He reports a lot of anxiety but states it is not as stressful at home now. 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 He notes his urine smells like fruit, he does not have a PCP.    History on Initial Assessment 12/18/2018: This is a very pleasant 36 year old right-handed man with a history of bipolar disorder presenting for evaluation of new onset seizures. The first seizure occurred in his sleep on 10/04/2018. He had been sleep deprived for 1-2 days with only 4 hours of sleep, he recalled feeling tired and had a little headache then woke up inside  the ambulance. His had bit his lip/tongue. He was brought to [facility] where CBC, BMP, head CT without contrast were unremarkable except for slight cerebellar tonsillar ectopia. UDS positive for THC. He had another seizure on 11/25/2018 again in his sleep. He again had sleep deprivation since they were short staffed at work, he works as a 11/27/2018. He got off work at [employer] and started moving his things into storage. He had a little nip of scotch and brandy, lay down, then woke up to family pushing him to go to the hospital. If felt like his muscles had a rigorous workout. He was brought to [facility] and as he was getting an MRI, he recalls hyperventilating when his head was put in the helmet vise, then he woke up 5 hours later with shoulder pain R>L. He was reportedly foaming at the mouth with tongue bite, with a convulsive seizure lasting a couple of minutes. He had an MRI brain without contrast which I personally reviewed, no acute changes, hippocampi symmetric with no abnormal signal. 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 His wake and sleep EEG was normal. He was discharged home on Keppra but stopped it after a week because it was making him hostile. His mother takes Depakote so he had been taking his mother's Depakote 500mg  BID since then and denies any  further convulsions.  He reports recurrent symptoms since 2018 when he started having panic attacks out of nowhere. He would start sweating and fell weak, like he would fall. Standing in front of a fan seemed to help, it would last 5-6 minutes then recur for several times. He has also been having a sensation running up and down his chest and back, an uncontrollable cool sensation that has quieted down some since starting medication. He recalls an episode at age 36 when he was driving to [facility] and it felt like his whole body paused, then he snapped back with the car  still in motion. He attributed this to smoking marijuana. He denies any staring/unresponsive episodes, gaps in time, olfactory/gustatory hallucinations, focal numbness/tingling/weakness, myoclonic jerks. Since Sunday, he would start having a pounding headache right before he takes his dose of Depakote. He denies any dizziness, diplopia, neck/back pain. No side effects on Depakote.   Epilepsy Risk Factors:  His mother, maternal aunt, and maternal cousin have seizures. Otherwise he had a normal birth and early development.  There is no history of febrile convulsions, CNS infections such as meningitis/encephalitis, significant traumatic brain injury, neurosurgical procedures.  Diagnostic Data: MRI brain without contrast in 11/2018 showed no acute changes, hippocampi symmetric with no abnormal signal. 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  His wake and sleep EEG in 11/2018 was normal. 48-hour EEG in 02/2020 showed frequent right anterior temporal epileptiform discharges exclusively in sleep. There were 3 electrographic seizures captured arising from the right temporal region lasting 70 seconds to 2.5 minutes with no clinical symptoms reported.   PAST MEDICAL HISTORY: Past Medical History:  Diagnosis Date   Bipolar disorder (HCC)    Family history of adverse reaction to anesthesia    problem with aunt waking up after surgery   GERD (gastroesophageal reflux disease)    Seizures (HCC)     MEDICATIONS: Current Outpatient Medications on File Prior to Visit  Medication Sig Dispense Refill   Ascorbic Acid (VITAMIN C PO) Take 3 tablets by mouth daily.     ciprofloxacin (CIPRO) 500 MG tablet Take 1 tablet (500 mg total) by mouth every 12 (twelve) hours. 10 tablet 0   divalproex (DEPAKOTE ER) 500 MG 24 hr tablet Take 2 tabs in AM, 3 tabs in PM 150 tablet 0   guaiFENesin (MUCINEX) 600 MG 12 hr  tablet Take 600 mg by mouth 2 (two) times daily as needed (Congestion).     naproxen sodium (ALEVE) 220 MG tablet Take 440 mg by mouth daily as needed (pain).     pantoprazole (PROTONIX) 20 MG tablet Take 1 tablet (20 mg total) by mouth daily. 30 tablet 0   predniSONE (DELTASONE) 10 MG tablet Take as directed. 21 tablet 0   Pseudoeph-Doxylamine-DM-APAP (NYQUIL PO) Take 1 Dose by mouth daily as needed (Itchy Throat).     SUMAtriptan (IMITREX) 50 MG tablet Take 1 tablet (50 mg total) by mouth every 2 (two) hours as needed for migraine. May repeat in 2 hours if headache persists or recurs. 10 tablet 3   No current facility-administered medications on file prior to visit.    ALLERGIES: Allergies  Allergen Reactions   Tramadol Nausea And Vomiting   Augmentin [Amoxicillin-Pot Clavulanate] Rash and Other (See Comments)    Has patient had a PCN reaction causing immediate rash, facial/tongue/throat swelling, SOB or lightheadedness with hypotension: No Has patient had a  PCN reaction causing severe rash involving mucus membranes or skin necrosis: Yes Has patient had a PCN reaction that required hospitalization Yes Has patient had a PCN reaction occurring within the last 10 years: No If all of the above answers are "NO", then may proceed with Cephalosporin use.    Nickel Rash    Burning    Silver Rash    Burning     FAMILY HISTORY: Family History  Problem Relation Age of Onset   Diabetes Mother    Hypertension Mother    Sarcoidosis Mother    Hypertension Father    Diabetes Maternal Grandfather    Hypertension Paternal Grandfather    Atrial fibrillation Paternal Grandfather    Parkinson's disease Paternal Grandfather     SOCIAL HISTORY: Social History   Socioeconomic History   Marital status: Single    Spouse name: Not on file   Number of children: Not on file   Years of education: Not on file   Highest education level: Not on file  Occupational History   Occupation: CNA   Tobacco Use   Smoking status: Former    Packs/day: 0.25    Types: Cigarettes    Quit date: 01/24/2021    Years since quitting: 1.0   Smokeless tobacco: Never   Tobacco comments:    smokes cigarettes occasionally  Vaping Use   Vaping Use: Former  Substance and Sexual Activity   Alcohol use: Not Currently    Comment: occasional   Drug use: Never   Sexual activity: Not Currently  Other Topics Concern   Not on file  Social History Narrative   Right handed    One level home   Highest level of edu- some college   Social Determinants of Health   Financial Resource Strain: Not on file  Food Insecurity: Not on file  Transportation Needs: Not on file  Physical Activity: Not on file  Stress: Not on file  Social Connections: Not on file  Intimate Partner Violence: Not on file     PHYSICAL EXAM: Vitals:   01/28/22 0901  BP: 115/76  Pulse: 70  SpO2: 99%   General: No acute distress Head:  Normocephalic/atraumatic Skin/Extremities: No rash, no edema Neurological Exam: alert and awake. No aphasia or dysarthria. Fund of knowledge is appropriate. Attention and concentration are normal.   Cranial nerves: Pupils equal, round. Extraocular movements intact with no nystagmus. Visual fields full.  No facial asymmetry.  Motor: Bulk and tone normal, muscle strength 5/5 throughout with no pronator drift.   Finger to nose testing intact.  Gait narrow-based and steady, able to tandem walk adequately.  Romberg negative.   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 MRI brain unremarkable. His 48-hour EEG showed frequent right anterior temporal epileptiform discharges exclusively in sleep with 3 electrographic seizures captured arising from the right temporal region lasting 70 seconds to 2.5 minutes with no clinical symptoms reported. He denies any nocturnal seizures since mid-January. Continue Depakote 500mg : 2 tabs in AM,  3 tabs in PM. He has prn sumatriptan for migraine rescue. We again discussed avoidance of seizure triggers. He is aware of [place] driving laws to stop driving after a seizure until 6 months seizure-free. He was encouraged to establish care with PCP for change in urine odor and anxiety, as well as his eye doctor for annual eye exams. Follow-up in 6 months, call for any changes.    Thank  you for allowing me to participate in his care.  Please do not hesitate to call for any questions or concerns.   Ellouise Newer, M.D.

## 2022-01-28 NOTE — Patient Instructions (Signed)
Always good to see you.  Continue Depakote 500mg : take 2 tablets in AM, 3 tablets in PM  2. Refills sent for Sumatriptan as needed for headache  3. Establish care with a PCP and regular eye doctor visits  4. Follow-up in 6 months, call for any changes   Seizure Precautions: 1. If medication has been prescribed for you to prevent seizures, take it exactly as directed.  Do not stop taking the medicine without talking to your doctor first, even if you have not had a seizure in a long time.   2. Avoid activities in which a seizure would cause danger to yourself or to others.  Don't operate dangerous machinery, swim alone, or climb in high or dangerous places, such as on ladders, roofs, or girders.  Do not drive unless your doctor says you may.  3. If you have any warning that you may have a seizure, lay down in a safe place where you can't hurt yourself.    4.  No driving for 6 months from last seizure, as per [facility].   Please refer to the following link on the [employer]ile[employer]'s website for more information: http://www.epilepsyfoundation.org/answerplace/Social/driving/drivingu.cfm   5.  Maintain good sleep hygiene. Avoid alcohol.  6.  Contact your doctor if you have any problems that may be related to the medicine you are taking.  7.  Call 911 and bring the patient back to the ED if:        A.  The seizure lasts longer than 5 minutes.       B.  The patient doesn't awaken shortly after the seizure  C.  The patient has new problems such as difficulty seeing, speaking or moving  D.  The patient was injured during the seizure  E.  The patient has a temperature over 102 F (39C)  F.  The patient vomited and now is having trouble breathing

## 2022-02-02 ENCOUNTER — Ambulatory Visit (HOSPITAL_COMMUNITY)
Admission: EM | Admit: 2022-02-02 | Discharge: 2022-02-02 | Disposition: A | Discharge status: Home / Self Care | Payer: Self-pay | Attending: Emergency Medicine | Admitting: Emergency Medicine

## 2022-02-02 ENCOUNTER — Encounter (HOSPITAL_COMMUNITY): Payer: Self-pay | Admitting: Emergency Medicine

## 2022-02-02 DIAGNOSIS — R109 Unspecified abdominal pain: Secondary | ICD-10-CM | POA: Insufficient documentation

## 2022-02-02 DIAGNOSIS — R82998 Other abnormal findings in urine: Secondary | ICD-10-CM | POA: Insufficient documentation

## 2022-02-02 LAB — COMPREHENSIVE METABOLIC PANEL
ALT: 16 U/L (ref 0–44)
AST: 18 U/L (ref 15–41)
Albumin: 3.8 g/dL (ref 3.5–5.0)
Alkaline Phosphatase: 40 U/L (ref 38–126)
Anion gap: 3 — ABNORMAL LOW (ref 5–15)
BUN: 16 mg/dL (ref 6–20)
CO2: 23 mmol/L (ref 22–32)
Calcium: 8.9 mg/dL (ref 8.9–10.3)
Chloride: 110 mmol/L (ref 98–111)
Creatinine, Ser: 1.06 mg/dL (ref 0.61–1.24)
GFR, Estimated: >60 mL/min (ref >60)
Glucose, Bld: 101 mg/dL — ABNORMAL HIGH (ref 70–99)
Potassium: 4.3 mmol/L (ref 3.5–5.1)
Sodium: 136 mmol/L (ref 135–145)
Total Bilirubin: 0.4 mg/dL (ref 0.3–1.2)
Total Protein: 6.3 g/dL — ABNORMAL LOW (ref 6.5–8.1)

## 2022-02-02 LAB — POCT URINALYSIS DIPSTICK, ED / UC
Bilirubin Urine: NEGATIVE
Glucose, UA: NEGATIVE mg/dL
Ketones, ur: NEGATIVE mg/dL
Leukocytes,Ua: NEGATIVE
Nitrite: NEGATIVE
Protein, ur: NEGATIVE mg/dL
Specific Gravity, Urine: 1.015 (ref 1.005–1.030)
Urobilinogen, UA: 0.2 mg/dL (ref 0.0–1.0)
pH: 6 (ref 5.0–8.0)

## 2022-02-02 LAB — CBG MONITORING, ED: Glucose-Capillary: 119 mg/dL — ABNORMAL HIGH (ref 70–99)

## 2022-02-02 NOTE — ED Provider Notes (Signed)
[facility]    CSN: [id number] Arrival date & time: 02/02/22  1301      History   Chief Complaint Chief Complaint  Patient presents with   Dysuria    HPI CAYDEN GRANHOLM is a 36 y.o. male.   Patient presents with cystic fruit smell to urine and intermittent frothiness with associated dysuria for 7 months.  Endorses bilateral lower back pain occurring intermittently for 2 months, consistently for 2 weeks.  Noticed small drops of blood in the urine 1 day ago.  Denies urinary frequency or urgency, lower abdominal pain or pressure, penile discharge, pain or testicle swelling.  Has not attempted treatment of symptoms.  History of seizures, taking Depakote, was recently evaluated by neurologist, unsure if related to medication.  Concern for kidney failure.  Has attempted to increase fluid intake drinking at least 1 gallon of water daily.   Past Medical History:  Diagnosis Date   Bipolar disorder [facility])    Family history of adverse reaction to anesthesia    problem with aunt waking up after surgery   GERD (gastroesophageal reflux disease)    Seizures (HCC)     Patient Active Problem List   Diagnosis Date Noted   Seizures (HCC) 11/25/2018    Past Surgical History:  Procedure Laterality Date   KNEE ARTHROSCOPY WITH MENISCAL REPAIR Right 09/19/2017   Procedure: KNEE ARTHROSCOPY WITH MENISCAL REPAIR;  Surgeon: Sheral Apley, MD;  Location:  SUR[facility]ERY C[facility];  Service: Orthopedics;  Laterality: Right;   TOOTH EXTRACTION         Home Medications    Prior to Admission medications   Medication Sig Start Date End Date Taking? Authorizing Provider  Ascorbic Acid (VITAMIN C PO) Take 3 tablets by mouth daily.    [provider]  divalproex (DEPAKOTE ER) 500 MG 24 hr tablet Take 2 tabs in AM, 3 tabs in PM 01/28/22   Van Clines, MD  guaiFENesin (MUCINEX) 600 MG 12 hr tablet Take 600 mg by mouth 2 (two) times daily as needed (Congestion).    [provider]  naproxen sodium (ALEVE) 220 MG tablet Take 440 mg by mouth daily as needed (pain).    [provider]  pantoprazole (PROTONIX) 20 MG tablet Take 1 tablet (20 mg total) by mouth daily. 12/22/20   Rolan Bucco, MD  Pseudoeph-Doxylamine-DM-APAP (NYQUIL PO) Take 1 Dose by mouth daily as needed (Itchy Throat).    [provider]  SUMAtriptan (IMITREX) 50 MG tablet Take 1 tablet (50 mg total) by mouth every 2 (two) hours as needed for migraine. May repeat in 2 hours if headache persists or recurs. 01/28/22   Van Clines, MD    Family History Family History  Problem Relation Age of Onset   Diabetes Mother    Hypertension Mother    Sarcoidosis Mother    Atrial fibrillation Mother    Hypertension Father    Diabetes Maternal Grandfather    Atrial fibrillation Maternal Grandfather    Hypertension Paternal Grandfather    Atrial fibrillation Paternal Grandfather    Parkinson's disease Paternal Grandfather     Social History Social History   Tobacco Use   Smoking status: Former    Packs/day: 0.25    Types: Cigarettes    Quit date: 01/24/2021    Years since quitting: 1.0   Smokeless tobacco: Never   Tobacco comments:    smokes cigarettes occasionally/ cigars  Vaping Use   Vaping Use: Former  Substance  Use Topics   Alcohol use: Not Currently    Comment: occasional   Drug use: Yes    Types: Marijuana     Allergies   Tramadol, Augmentin [amoxicillin-pot clavulanate], Nickel, and Silver   Review of Systems Review of Systems  Constitutional: Negative.   Respiratory: Negative.    Cardiovascular: Negative.   Genitourinary:  Positive for dysuria. Negative for decreased urine volume, difficulty urinating, enuresis, flank pain, frequency, genital sores, hematuria, penile discharge, penile pain, penile swelling, scrotal swelling, testicular pain and urgency.  Skin: Negative.   Neurological: Negative.      Physical Exam Triage Vital Signs ED  Triage Vitals  Enc Vitals Group     BP 02/02/22 1316 (!) 154/91     Pulse Rate 02/02/22 1316 67     Resp 02/02/22 1316 17     Temp 02/02/22 1316 98.2 F (36.8 C)     Temp Source 02/02/22 1316 Oral     SpO2 02/02/22 1316 97 %     Weight --      Height --      Head Circumference --      Peak Flow --      Pain Score 02/02/22 1313 4     Pain Loc --      Pain Edu? --      Excl. in GC? --    No data found.  Updated Vital Signs BP (!) 154/91 (BP Location: Left Arm)   Pulse 67   Temp 98.2 F (36.8 C) (Oral)   Resp 17   SpO2 97%   Visual Acuity Right Eye Distance:   Left Eye Distance:   Bilateral Distance:    Right Eye Near:   Left Eye Near:    Bilateral Near:     Physical Exam Constitutional:      Appearance: Normal appearance.  HENT:     Head: Normocephalic.  Eyes:     Extraocular Movements: Extraocular movements intact.  Pulmonary:     Effort: Pulmonary effort is normal.  Abdominal:     General: Abdomen is flat. Bowel sounds are normal.     Palpations: Abdomen is soft.     Tenderness: There is abdominal tenderness in the suprapubic area. There is right CVA tenderness and left CVA tenderness.  Neurological:     Mental Status: He is alert and oriented to person, place, and time. Mental status is at baseline.  Psychiatric:        Mood and Affect: Mood normal.        Behavior: Behavior normal.      UC Treatments / Results  Labs (all labs ordered are listed, but only abnormal results are displayed) Labs Reviewed  POCT URINALYSIS DIPSTICK, ED / UC - Abnormal; Notable for the following components:      Result Value   Hgb urine dipstick TRACE (*)    All other components within normal limits    EKG   Radiology No results found.  Procedures Procedures (including critical care time)  Medications Ordered in UC Medications - No data to display  Initial Impression / Assessment and Plan / UC Course  I have reviewed the triage vital signs and the nursing  notes.  Pertinent labs & imaging results that were available during my care of the patient were reviewed by me and considered in my medical decision making (see chart for details).  Sweet urine odor, frothy urine, bilateral flank pain  Unknown etiology, urinalysis was negative showing only trace blood, point-of-care  CBG 119 and CMP is pending, discussed results with patient, low suspicion for infectious cause or kidney stones due to timeline of symptoms PCP referral placed and patient given handout on how to schedule appointment, also given walker referral to urology, may follow-up with this urgent care as needed, work note given Final Clinical Impressions(s) / UC Diagnoses   Final diagnoses:  None   Discharge Instructions   None    ED Prescriptions   None    PDMP not reviewed this encounter.   Valinda Hoar, NP 02/02/22 1425

## 2022-02-02 NOTE — ED Triage Notes (Signed)
Pt reports that urine is frothy and can be dark with odor that has been intermittent for about 7 months. Reports that having lower back pains for almost 2 months. Takes Depakote for seizures but hasnt seen PCP for medications so get 1000 mg a day from mother who takes it as well. Pt is concerned about kidney failure. Trying to drink gallon water per day.

## 2022-02-02 NOTE — Discharge Instructions (Addendum)
At this time we are unable to determine a cause for your symptoms and you will require further work-up and evaluation, based on your timeline of symptoms this is not consistent with infection or kidney stones  Your blood sugar is 119 which is not alarmingly high  Your urinalysis was negative for signs of infection  We will complete lab work to check your electrolytes, kidney and liver function, you will be notified of any concerning values  A primary care referral has been placed for you to help you establish care  You have also been given information to urology for further evaluation as needed

## 2022-02-03 ENCOUNTER — Encounter: Payer: Self-pay | Admitting: Emergency Medicine

## 2022-02-20 ENCOUNTER — Encounter (HOSPITAL_COMMUNITY): Payer: Self-pay

## 2022-02-20 ENCOUNTER — Other Ambulatory Visit: Payer: Self-pay

## 2022-02-20 ENCOUNTER — Emergency Department (HOSPITAL_COMMUNITY)
Admission: EM | Admit: 2022-02-20 | Discharge: 2022-02-20 | Discharge status: Against Medical Advice | Payer: Self-pay | Attending: Emergency Medicine | Admitting: Emergency Medicine

## 2022-02-20 DIAGNOSIS — R569 Unspecified convulsions: Secondary | ICD-10-CM | POA: Insufficient documentation

## 2022-02-20 DIAGNOSIS — Z5321 Procedure and treatment not carried out due to patient leaving prior to being seen by health care provider: Secondary | ICD-10-CM | POA: Insufficient documentation

## 2022-02-20 NOTE — ED Notes (Signed)
Walked out of room and said I'm leaving. Removed iv and eloped from treatment room.

## 2022-02-20 NOTE — ED Triage Notes (Signed)
Arrives EMS from home after witnessed seizure ~45 seconds. Upon paramedic arrival pt was in a postictal state.   Denies missing any Depakote doses.

## 2022-03-02 ENCOUNTER — Telehealth: Payer: Self-pay | Admitting: Neurology

## 2022-03-02 NOTE — Telephone Encounter (Signed)
Please call patient  Patient states that last week he had seizure that lasted over 15 mins ( his friend went in to check on him and he was having a seizure and stayed with him for over 15 mins before he stopped) and the next night he had a 8 min seizure. He did call EMS and went to the hospital. He states he is still not feeling himself. But wanted to let Karel Jarvis know

## 2022-03-03 ENCOUNTER — Other Ambulatory Visit: Payer: Self-pay

## 2022-03-03 DIAGNOSIS — G40009 Localization-related (focal) (partial) idiopathic epilepsy and epileptic syndromes with seizures of localized onset, not intractable, without status epilepticus: Secondary | ICD-10-CM

## 2022-03-03 NOTE — Telephone Encounter (Signed)
I would like to check his Depakote level to see how much room we have to increase or if we need to add a second seizure medication. He will need to have it done either first thing in the morning before he takes his morning dose, or close to when lab closes in the afternoon. If he has bloodwork done soon after taking medication, it will not be an accurate level. Thanks

## 2022-03-03 NOTE — Telephone Encounter (Signed)
Patient didn't stay at hospital left without treatment. 02/23/2022. Seizure was last Wednesday. Pt c/o: seizure Missed medications?no   Sleep deprived? no  Alcohol intake?  no Increased stress? yes Any change in medication color or shape? no Back to their usual baseline self?  no Current medications prescribed by Dr. Karel Jarvis:  didalproex 500mg  2 tab po am, 3 pm  Please advise, he says he is just tired today.

## 2022-03-03 NOTE — Telephone Encounter (Signed)
Patient advised will come in the am to have labs drawn, lab ordered and patient advised

## 2022-03-04 ENCOUNTER — Other Ambulatory Visit: Payer: Self-pay

## 2022-03-04 DIAGNOSIS — G40009 Localization-related (focal) (partial) idiopathic epilepsy and epileptic syndromes with seizures of localized onset, not intractable, without status epilepticus: Secondary | ICD-10-CM

## 2022-03-05 LAB — VALPROIC ACID LEVEL: Valproic Acid Lvl: 124.7 mg/L — ABNORMAL HIGH (ref 50.0–100.0)

## 2022-03-10 ENCOUNTER — Telehealth: Payer: Self-pay

## 2022-03-10 MED ORDER — ZONISAMIDE 100 MG PO CAPS: 100.0000 mg | ORAL_CAPSULE | Freq: Every evening | ORAL | 1 refills | Status: DC

## 2022-03-10 NOTE — Telephone Encounter (Signed)
Pt called an informed that  Depakote level is already high (but no need to change dose). Dr Karel Jarvis would like to add on a second seizure medication that also helps with headaches, called Zonisamide. Would start low dose 100mg  qhs. Side effects can include drowsiness, numbness/tingling in hands and feet, so don't get scared if that happens. Carbonated drinks may taste different. There is a very low risk of kidney stones, but usually in those who had it before, important to keep hydrated. If you are agreeable, I will send in Rx for Zonisamide 100mg  qhs. Continue Depakote. RX sent in for pt

## 2022-03-10 NOTE — Telephone Encounter (Signed)
-----   Message from Van Clines, MD sent at 03/08/2022 11:50 AM EDT ----- Pls let him know Depakote level is already high (but no need to change dose). I would like to add on a second seizure medication that also helps with headaches, called Zonisamide. Would start low dose 100mg  qhs. Side effects can include drowsiness, numbness/tingling in hands and feet, so don't get scared if that happens. Carbonated drinks may taste different. There is a very low risk of kidney stones, but usually in those who had it before, important to keep hydrated. If he is agreeable, pls send in Rx for Zonisamide 100mg  qhs. Continue Depakote. Thanks

## 2022-05-16 ENCOUNTER — Emergency Department (HOSPITAL_COMMUNITY)
Admission: EM | Admit: 2022-05-16 | Discharge: 2022-05-16 | Discharge status: Against Medical Advice | Payer: Self-pay | Attending: Emergency Medicine | Admitting: Emergency Medicine

## 2022-05-16 ENCOUNTER — Other Ambulatory Visit: Payer: Self-pay

## 2022-05-16 ENCOUNTER — Emergency Department (HOSPITAL_COMMUNITY): Payer: Self-pay

## 2022-05-16 DIAGNOSIS — R202 Paresthesia of skin: Secondary | ICD-10-CM | POA: Insufficient documentation

## 2022-05-16 DIAGNOSIS — R079 Chest pain, unspecified: Secondary | ICD-10-CM | POA: Insufficient documentation

## 2022-05-16 DIAGNOSIS — Z5321 Procedure and treatment not carried out due to patient leaving prior to being seen by health care provider: Secondary | ICD-10-CM | POA: Insufficient documentation

## 2022-05-16 LAB — CBC
HCT: 44.2 % (ref 39.0–52.0)
Hemoglobin: 14.9 g/dL (ref 13.0–17.0)
MCH: 30.5 pg (ref 26.0–34.0)
MCHC: 33.7 g/dL (ref 30.0–36.0)
MCV: 90.4 fL (ref 80.0–100.0)
Platelets: 120 10*3/uL — ABNORMAL LOW (ref 150–400)
RBC: 4.89 MIL/uL (ref 4.22–5.81)
RDW: 14.5 % (ref 11.5–15.5)
WBC: 6.9 10*3/uL (ref 4.0–10.5)
nRBC: 0 % (ref 0.0–0.2)

## 2022-05-16 LAB — BASIC METABOLIC PANEL
Anion gap: 12 (ref 5–15)
BUN: 26 mg/dL — ABNORMAL HIGH (ref 6–20)
CO2: 21 mmol/L — ABNORMAL LOW (ref 22–32)
Calcium: 9.3 mg/dL (ref 8.9–10.3)
Chloride: 106 mmol/L (ref 98–111)
Creatinine, Ser: 1.33 mg/dL — ABNORMAL HIGH (ref 0.61–1.24)
GFR, Estimated: >60 mL/min (ref >60)
Glucose, Bld: 91 mg/dL (ref 70–99)
Potassium: 4.4 mmol/L (ref 3.5–5.1)
Sodium: 139 mmol/L (ref 135–145)

## 2022-05-16 LAB — TROPONIN I (HIGH SENSITIVITY): Troponin I (High Sensitivity): 5 ng/L (ref <18)

## 2022-05-16 MED ORDER — ASPIRIN 81 MG PO CHEW
324.0000 mg | CHEWABLE_TABLET | Freq: Once | ORAL | Status: AC
  Administered 2022-05-16: 324 mg via ORAL
  Filled 2022-05-16: qty 4

## 2022-05-16 NOTE — ED Triage Notes (Signed)
Pt's mother reports he has had circumoral, BUE, and chest  numbness and cold feet and hands, intermittent  x 6 months but worsening recently. Hx stress seizures and medicated with Depakote for same.

## 2022-05-16 NOTE — ED Provider Triage Note (Signed)
Emergency Medicine Provider Triage Evaluation Note  Todd Yoder , a 36 y.o. male  was evaluated in triage.  Pt complains of pain x6 months.  History of stress seizures. Reports sharp chest pain, circumoral paresthesia, bilateral upper extremity paresthesia, and mother reports loss of consciousness  Review of Systems  Positive: Cp   Negative: fever  Physical Exam  BP 130/79   Pulse 71   Temp 98.1 F (36.7 C) (Oral)   Resp 16   SpO2 99%  Gen:   Awake, no distress   Resp:  Normal effort  MSK:   Moves extremities without difficulty  Other:  anxious  Medical Decision Making  Medically screening exam initiated at 11:39 AM.  Appropriate orders placed.  Todd Yoder was informed that the remainder of the evaluation will be completed by another provider, this initial triage assessment does not replace that evaluation, and the importance of remaining in the ED until their evaluation is complete.  Suspect functional cp and anxiety   Margarita Mail, PA-C 05/16/22 1141

## 2022-05-16 NOTE — ED Notes (Signed)
Called x 4 for vitals without response

## 2022-06-28 ENCOUNTER — Telehealth: Payer: Self-pay | Admitting: Neurology

## 2022-06-28 NOTE — Telephone Encounter (Signed)
Pt called in and left a message with the access nurse. The pt has questions about his medication. Pt takes depakote 2,500mg  daily for approx 1.5 years and cannot afford other meds  prescribed for him. He cannot get extra work shifts-asks if his PCP will approve him to attempt to take job. Pt would be gone 3-4 weeks at a time, not sure if he can do that. Had last seizure 5-5.5 mo.  Full access nurse report is in Dr. Rosalyn Gess box

## 2022-06-28 NOTE — Telephone Encounter (Signed)
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 He was advised that if he is already  this stressed over the job then no maybe its not a good fit for him and he said that is what he thought too.  He stated that he can not afford his zonisamide and his depakote I told him about the epilepsy foundation he is going to look that up when we get off the phone,

## 2022-07-05 NOTE — Telephone Encounter (Signed)
Agree, thanks

## 2022-08-26 ENCOUNTER — Ambulatory Visit: Payer: Self-pay | Admitting: Neurology

## 2022-09-06 ENCOUNTER — Encounter: Payer: Self-pay | Admitting: Neurology

## 2022-09-06 ENCOUNTER — Other Ambulatory Visit (INDEPENDENT_AMBULATORY_CARE_PROVIDER_SITE_OTHER): Payer: BLUE CROSS/BLUE SHIELD

## 2022-09-06 ENCOUNTER — Ambulatory Visit: Payer: BLUE CROSS/BLUE SHIELD | Admitting: Neurology

## 2022-09-06 VITALS — BP 134/88 | HR 62 | Ht 77.0 in | Wt 215.0 lb

## 2022-09-06 DIAGNOSIS — R52 Pain, unspecified: Secondary | ICD-10-CM | POA: Diagnosis not present

## 2022-09-06 DIAGNOSIS — G40009 Localization-related (focal) (partial) idiopathic epilepsy and epileptic syndromes with seizures of localized onset, not intractable, without status epilepticus: Secondary | ICD-10-CM

## 2022-09-06 MED ORDER — CYCLOBENZAPRINE HCL 5 MG PO TABS: 5.0000 mg | ORAL_TABLET | Freq: Three times a day (TID) | ORAL | 11 refills | Status: AC | PRN

## 2022-09-06 NOTE — Patient Instructions (Signed)
I hope you feel better soon.  Have bloodwork done for Depakote level, CBC, CMP, ESR, CRP, ANA  2. Schedule MRI brain with and without contrast  3. Take Flexeril '5mg'$  every 8 hours as needed for body pains  4. Continue Depakote as you are taking it now, we may change dose depending on level  5. Follow-up in 3 months, call for any changes

## 2022-09-06 NOTE — Progress Notes (Signed)
NEUROLOGY FOLLOW UP OFFICE NOTE  RAYON RUDKIN [id number] [birth year]  HISTORY OF PRESENT ILLNESS: I had the pleasure of seeing Todd Yoder in follow-up in the neurology clinic on 09/06/2022.  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 He is accompanied by his mother who helps supplement the history today. Records and images were personally reviewed where available.  He is on Depakote ER '1000mg'$  in AM, '500mg'$  in afternoon, '1000mg'$  at bedtime. Zonisamide '100mg'$  qhs was added in August, however he reported that he could not afford it. He and his mother report an increase in seizures, last seizure was 05/16/22. He had one seizure lasting 20 minutes, then he had another one several hours later lasting 12 minutes. His mother reports he was standing talking to her, then he was not responding, turned, clapped 4 times with arms extended than had a convulsion. He reports that since then, he has not been able to pull it together. He has been having constant pain all over, mostly in his shoulders. He is always tired. His mother also reports that he sleepwalks. Last week she watched him get up and go to the kitchen, turn around and lay back down. She was talking to him and at first he did not answer then said it's alright and turned over. He did not remember this the next day and said he did not get up. She is concerned that he has always been very agile but now he falls and trips, losing his balance frequently. Last fall was 2 days ago. He denies any numbness/tingling in his extremities, no bowel/bladder dysfunction. He has pain in his upper body, head and neck get tight, and also lower back pain. He can hardly bend when he tries to stretch. He has not been back to work since November, he has no energy. He feels the Depakote really does work. He feels it when he is due for the next dose. He denies any abdominal pain, nausea/vomiting or diarrhea. No tremors.    History on Initial  Assessment 12/18/2018: This is a very pleasant 37 year old right-handed man with a history of bipolar disorder presenting for evaluation of new onset seizures. The first seizure occurred in his sleep on 10/04/2018. He had been sleep deprived for 1-2 days with only 4 hours of sleep, he recalled feeling tired and had a little headache then woke up inside the ambulance. His had bit his lip/tongue. He was brought to [facility] where CBC, BMP, head CT without contrast were unremarkable except for slight cerebellar tonsillar ectopia. UDS positive for THC. He had another seizure on 11/25/2018 again in his sleep. He again had sleep deprivation since they were short staffed at work, he works as a Quarry manager. He got off work at [employer] and started moving his things into storage. He had a little nip of scotch and brandy, lay down, then woke up to family pushing him to go to the hospital. If felt like his muscles had a rigorous workout. He was brought to [facility] and as he was getting an MRI, he recalls hyperventilating when his head was put in the helmet vise, then he woke up 5 hours later with shoulder pain R>L. He was reportedly foaming at the mouth with tongue bite, with a convulsive seizure lasting a couple of minutes. He had an MRI brain without contrast which I personally reviewed, no acute changes, hippocampi symmetric with no abnormal signal. 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 His wake and sleep EEG was normal. He was discharged home on Keppra but stopped it after a week because it was making him hostile. His mother takes Depakote so he had been taking his mother's Depakote '500mg'$  BID since then and denies any further convulsions.  He reports recurrent symptoms since 2018 when he started having panic attacks out of nowhere. He would start sweating and fell weak, like he would fall. Standing in front of a fan seemed to help, it would  last 5-6 minutes then recur for several times. He has also been having a sensation running up and down his chest and back, an uncontrollable cool sensation that has quieted down some since starting medication. He recalls an episode at age 89 [facility] felt like his whole body paused, then he snapped back with the car still in motion. He attributed this to smoking marijuana. He denies any staring/unresponsive episodes, gaps in time, olfactory/gustatory hallucinations, focal numbness/tingling/weakness, myoclonic jerks. Since Sunday, he would start having a pounding headache right before he takes his dose of Depakote. He denies any dizziness, diplopia, neck/back pain. No side effects on Depakote.   Epilepsy Risk Factors:  His mother, maternal aunt, and maternal cousin have seizures. Otherwise he had a normal birth and early development.  There is no history of febrile convulsions, CNS infections such as meningitis/encephalitis, significant traumatic brain injury, neurosurgical procedures.  Diagnostic Data: MRI brain without contrast in 11/2018 showed no acute changes, hippocampi symmetric with no abnormal signal. 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  His wake and sleep EEG in 11/2018 was normal. 48-hour EEG in 02/2020 showed frequent right anterior temporal epileptiform discharges exclusively in sleep. There were 3 electrographic seizures captured arising from the right temporal region lasting 70 seconds to 2.5 minutes with no clinical symptoms reported.  PAST MEDICAL HISTORY: Past Medical History:  Diagnosis Date   [place]   Family history of adverse reaction to anesthesia    problem with aunt waking up after surgery   GERD (gastroesophageal reflux disease)    Seizures (HCC)     MEDICATIONS: Current Outpatient Medications on File Prior to Visit   Medication Sig Dispense Refill   divalproex (DEPAKOTE ER) 500 MG 24 hr tablet Take 2 tabs in AM, 3 tabs in PM 450 tablet 3   Pseudoeph-Doxylamine-DM-APAP (NYQUIL PO) Take 1 Dose by mouth daily as needed (Itchy Throat).     zonisamide (ZONEGRAN) 100 MG capsule Take 1 capsule (100 mg total) by mouth at bedtime. (Patient not taking) 90 capsule 1   No current facility-administered medications on file prior to visit.    ALLERGIES: Allergies  Allergen Reactions   Tramadol Nausea And Vomiting   Augmentin [Amoxicillin-Pot Clavulanate] Rash and Other (See Comments)    Has patient had a PCN reaction causing immediate rash, facial/tongue/throat swelling, SOB or lightheadedness with hypotension: No Has patient had a PCN reaction causing severe rash involving mucus membranes or skin necrosis: Yes Has patient had a PCN reaction that required hospitalization Yes Has patient had a PCN reaction occurring within the last 10 years: No If all of the above answers are "NO", then may proceed with Cephalosporin use.    Nickel Rash    Burning    Silver Rash    Burning  FAMILY HISTORY: Family History  Problem Relation Age of Onset   Diabetes Mother    Hypertension Mother    Sarcoidosis Mother    Atrial fibrillation Mother    Hypertension Father    Diabetes Maternal Grandfather    Atrial fibrillation Maternal Grandfather    Hypertension Paternal Grandfather    Atrial fibrillation Paternal Grandfather    Parkinson's disease Paternal Grandfather     SOCIAL HISTORY: Social History   Socioeconomic History   Marital status: Single    Spouse name: Not on file   Number of children: Not on file   Years of education: Not on file   Highest education level: Not on file  Occupational History   Occupation: CNA  Tobacco Use   Smoking status: Former    Packs/day: 0.25    Types: Cigarettes    Quit date: 01/24/2021    Years since quitting: 1.6   Smokeless tobacco: Never   Tobacco comments:     smokes cigarettes occasionally/ cigars  Vaping Use   Vaping Use: Former  Substance and Sexual Activity   Alcohol use: Not Currently    Comment: occasional   Drug use: Yes    Types: Marijuana    Comment: 3 a week   Sexual activity: Not Currently  Other Topics Concern   Not on file  Social History Narrative   Right handed    One level home   Highest level of edu- some college   Caffeine none   Not working    Lives with family   Social Determinants of Health   Financial Resource Strain: Not on file  Food Insecurity: Not on file  Transportation Needs: Not on file  Physical Activity: Not on file  Stress: Not on file  Social Connections: Not on file  Intimate Partner Violence: Not on file     PHYSICAL EXAM: Vitals:   09/06/22 1548  BP: 134/88  Pulse: 62  SpO2: 100%   General: No acute distress Head:  Normocephalic/atraumatic Skin/Extremities: No rash, no edema Neurological Exam: alert and awake. No aphasia or dysarthria. Fund of knowledge is appropriate. Attention and concentration are normal.   Cranial nerves: Pupils equal, round. Extraocular movements intact with no nystagmus. Visual fields full.  No facial asymmetry.  Motor: Bulk and tone normal, muscle strength 5/5 throughout with no pronator drift. Reflexes +1 throughout. Finger to nose testing intact.  Gait narrow-based, slightly unsteady, no ataxia. Romberg negative. No tremors.    IMPRESSION: This is a very pleasant 37 yo RH man with a history of bipolar disorder with right temporal lobe epilepsy. MRI brain unremarkable. His 48-hour EEG showed frequent right anterior temporal epileptiform discharges exclusively in sleep with 3 electrographic seizures captured arising from the right temporal region lasting 70 seconds to 2.5 minutes with no clinical symptoms reported. Since a cluster of prolonged seizures in November, he has not returend to baseline. He reports diffuse body pains, lack of energy, increase in falls. MRI  brain with and without contrast will be ordered to assess for underlying structural abnormality. Bloodwork for CBC, CMP, Depakote level, ESR, CRP, ANA will be ordered. He will try Flexeril '5mg'$  as needed for body pain, side effects discussed. For now continue Depakote 1000-500-'1000mg'$ , however we may change dose depending on bloodwork. He is aware of [place] driving laws to stop driving after a seizure until 6 months seizure-free. Follow-up in 3 months, call for any changes.    Thank you for allowing me to participate in his care.  Please do not hesitate to call for any questions or concerns.    Ellouise Newer, M.D.

## 2022-09-07 LAB — COMPREHENSIVE METABOLIC PANEL
AG Ratio: 1.8 (calc) (ref 1.0–2.5)
ALT: 18 U/L (ref 9–46)
AST: 27 U/L (ref 10–40)
Albumin: 4.2 g/dL (ref 3.6–5.1)
Alkaline phosphatase (APISO): 49 U/L (ref 36–130)
BUN: 23 mg/dL (ref 7–25)
CO2: 29 mmol/L (ref 20–32)
Calcium: 9 mg/dL (ref 8.6–10.3)
Chloride: 104 mmol/L (ref 98–110)
Creat: 1.12 mg/dL (ref 0.60–1.26)
Globulin: 2.4 g/dL (calc) (ref 1.9–3.7)
Glucose, Bld: 76 mg/dL (ref 65–99)
Potassium: 4.4 mmol/L (ref 3.5–5.3)
Sodium: 138 mmol/L (ref 135–146)
Total Bilirubin: 0.6 mg/dL (ref 0.2–1.2)
Total Protein: 6.6 g/dL (ref 6.1–8.1)

## 2022-09-07 LAB — ANA: Anti Nuclear Antibody (ANA): NEGATIVE

## 2022-09-07 LAB — CBC
HCT: 42.6 % (ref 38.5–50.0)
Hemoglobin: 14.1 g/dL (ref 13.2–17.1)
MCH: 30.7 pg (ref 27.0–33.0)
MCHC: 33.1 g/dL (ref 32.0–36.0)
MCV: 92.6 fL (ref 80.0–100.0)
MPV: 13 fL — ABNORMAL HIGH (ref 7.5–12.5)
Platelets: 109 10*3/uL — ABNORMAL LOW (ref 140–400)
RBC: 4.6 10*6/uL (ref 4.20–5.80)
RDW: 12.9 % (ref 11.0–15.0)
WBC: 5.6 10*3/uL (ref 3.8–10.8)

## 2022-09-07 LAB — VALPROIC ACID LEVEL: Valproic Acid Lvl: 109 mg/L — ABNORMAL HIGH (ref 50.0–100.0)

## 2022-09-07 LAB — SEDIMENTATION RATE: Sed Rate: 2 mm/h (ref 0–15)

## 2022-09-07 LAB — C-REACTIVE PROTEIN: CRP: 0.2 mg/L (ref <8.0)

## 2022-09-12 ENCOUNTER — Telehealth: Payer: Self-pay

## 2022-09-12 NOTE — Telephone Encounter (Signed)
Patients mother called in stating at patients last appointment she forgot to mention a few details. Wanted to let Dr.Aquino know that last year he had 15-20 seizures and over half those he was never seen at the hospital but they would last at least 3-12 minutes, if not longer. With all these seizures he takes longer and longer to come out, it can take about 2 hours. Todd Yoder's behavior is never predictable after he wakes up but one major concern is his memory, wondering if he has a seizure that he wont come back normal or comes back forgetting information. For instance after he woke up after one, he didn't know he had a brother and sometimes he doesn't remember his mom. Mom wants to know was a caregiver for him, what can she do to help him with all this or how can she assist when these things happen. Right now when he wakes up and can't remember, she tries to give him details of recent events for him, try to jog his memory of family and friends who are closer to him but wants to know if this is okay or if there is more she can do for this.

## 2022-09-13 NOTE — Telephone Encounter (Signed)
Called and talked with Todd Yoder and got permission to talk with mother. Informed her of the answer Dr. Delice Lesch sent to the [facility]. She understood and was happy.

## 2022-09-13 NOTE — Telephone Encounter (Signed)
Pls let her know she is doing a good job with being reassuring and supportive. It is typical for patients to be confused and not recognize family soon after a seizure. It is not permanent and will recover as his brain recovers from the seizure. Proceed with brain MRI as discussed.

## 2022-09-16 ENCOUNTER — Telehealth: Payer: Self-pay

## 2022-09-16 NOTE — Telephone Encounter (Signed)
-----   Message from Cameron Sprang, MD sent at 09/16/2022  1:56 PM EST ----- Pls let Todd Yoder know the bloodwork showed normal electrolytes, liver, kidney function. His platelet count is low, but that can be from Depakote. Depakote level was only slightly more than range but would continue on his current dose for now. Proceed with brain MRI. thanks

## 2022-09-16 NOTE — Telephone Encounter (Signed)
Pt called an informed that the bloodwork showed normal electrolytes, liver, kidney function. His platelet count is low, but that can be from Depakote. Depakote level was only slightly more than range but would continue on his current dose for now. Proceed with brain MRI.

## 2022-12-26 ENCOUNTER — Encounter: Payer: Self-pay | Admitting: Neurology

## 2022-12-26 ENCOUNTER — Ambulatory Visit: Payer: BLUE CROSS/BLUE SHIELD | Admitting: Neurology

## 2022-12-26 DIAGNOSIS — Z029 Encounter for administrative examinations, unspecified: Secondary | ICD-10-CM

## 2023-02-16 ENCOUNTER — Other Ambulatory Visit (INDEPENDENT_AMBULATORY_CARE_PROVIDER_SITE_OTHER): Payer: BLUE CROSS/BLUE SHIELD

## 2023-02-16 ENCOUNTER — Encounter: Payer: Self-pay | Admitting: Neurology

## 2023-02-16 ENCOUNTER — Ambulatory Visit: Payer: BLUE CROSS/BLUE SHIELD | Admitting: Neurology

## 2023-02-16 VITALS — BP 130/80 | HR 58 | Ht 77.0 in | Wt 222.5 lb

## 2023-02-16 DIAGNOSIS — M549 Dorsalgia, unspecified: Secondary | ICD-10-CM

## 2023-02-16 DIAGNOSIS — G40009 Localization-related (focal) (partial) idiopathic epilepsy and epileptic syndromes with seizures of localized onset, not intractable, without status epilepticus: Secondary | ICD-10-CM

## 2023-02-16 DIAGNOSIS — M542 Cervicalgia: Secondary | ICD-10-CM | POA: Diagnosis not present

## 2023-02-16 DIAGNOSIS — R296 Repeated falls: Secondary | ICD-10-CM | POA: Diagnosis not present

## 2023-02-16 DIAGNOSIS — Z79899 Other long term (current) drug therapy: Secondary | ICD-10-CM

## 2023-02-16 DIAGNOSIS — R2689 Other abnormalities of gait and mobility: Secondary | ICD-10-CM

## 2023-02-16 DIAGNOSIS — R52 Pain, unspecified: Secondary | ICD-10-CM

## 2023-02-16 LAB — COMPREHENSIVE METABOLIC PANEL
ALT: 20 U/L (ref 0–53)
AST: 26 U/L (ref 0–37)
Albumin: 4.1 g/dL (ref 3.5–5.2)
Alkaline Phosphatase: 47 U/L (ref 39–117)
BUN: 34 mg/dL — ABNORMAL HIGH (ref 6–23)
CO2: 26 mEq/L (ref 19–32)
Calcium: 8.8 mg/dL (ref 8.4–10.5)
Chloride: 104 mEq/L (ref 96–112)
Creatinine, Ser: 1.3 mg/dL (ref 0.40–1.50)
GFR: 71.52 mL/min (ref >60.00)
Glucose, Bld: 101 mg/dL — ABNORMAL HIGH (ref 70–99)
Potassium: 4 mEq/L (ref 3.5–5.1)
Sodium: 137 mEq/L (ref 135–145)
Total Bilirubin: 0.4 mg/dL (ref 0.2–1.2)
Total Protein: 6.6 g/dL (ref 6.0–8.3)

## 2023-02-16 LAB — CBC
HCT: 42.6 % (ref 39.0–52.0)
Hemoglobin: 13.8 g/dL (ref 13.0–17.0)
MCHC: 32.4 g/dL (ref 30.0–36.0)
MCV: 93.6 fl (ref 78.0–100.0)
Platelets: 122 10*3/uL — ABNORMAL LOW (ref 150.0–400.0)
RBC: 4.55 Mil/uL (ref 4.22–5.81)
RDW: 14.4 % (ref 11.5–15.5)
WBC: 5.5 10*3/uL (ref 4.0–10.5)

## 2023-02-16 MED ORDER — DIVALPROEX SODIUM ER 500 MG PO TB24: ORAL_TABLET | ORAL | 3 refills | Status: DC

## 2023-02-16 MED ORDER — GABAPENTIN 300 MG PO CAPS: ORAL_CAPSULE | ORAL | 3 refills | Status: DC

## 2023-02-16 NOTE — Addendum Note (Signed)
Addended by: Clearnce Sorrel on: 02/16/2023 10:06 AM   Modules accepted: Orders

## 2023-02-16 NOTE — Progress Notes (Signed)
NEUROLOGY FOLLOW UP OFFICE NOTE  Todd Yoder [id number] 01-23-1986  HISTORY OF PRESENT ILLNESS: I had the pleasure of seeing Todd Yoder in follow-up in the neurology clinic on 02/16/2023.  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 He is again accompanied by his mother who helps supplement the history today.  Records and images were personally reviewed where available. On his last visit, he reported not doing well, with pain all over, imbalance and falls. He is on Depakote 1000mg  in AM, 1500mg  in PM. Bloodwork done in 08/2022 showed a Depakote level of 109, platelet count 109. CMP, ANA, ESR, CRP normal. He had not done MRI brain yet. Today they deny any seizures since 05/2022. He however continues not doing well overall. He states he does not know what is going on. His walking/balance is not good, he falls frequently. His mother reports he is "like a child toddling around the house." He falls at least once a week, he had a big fall last week, last fall was yesterday. He has tightness in his upper back, Flexeril helps some but he reports his whole body hurts, "too heavy for my skeleton." He has burning in his arms, legs, shooting pain from his buttock region up his spine. He states he is declining, he has trouble doing housework. His mother notes he does not take a break, he would fall then keep going after. He states that even when he is just standing and not moving, he feels like he would fall.    History on Initial Assessment 12/18/2018: This is a very pleasant 37 year old right-handed man with a history of bipolar disorder presenting for evaluation of new onset seizures. The first seizure occurred in his sleep on 10/04/2018. He had been sleep deprived for 1-2 days with only 4 hours of sleep, he recalled feeling tired and had a little headache then woke up inside the ambulance. His had bit his lip/tongue. He was brought to [facility] where CBC, BMP, head CT without contrast  were unremarkable except for slight cerebellar tonsillar ectopia. UDS positive for THC. He had another seizure on 11/25/2018 again in his sleep. He again had sleep deprivation since they were short staffed at work, he works as a Lawyer. He got off work at [employer] and started moving his things into storage. He had a little nip of scotch and brandy, lay down, then woke up to family pushing him to go to the hospital. If felt like his muscles had a rigorous workout. He was brought to [facility] and as he was getting an MRI, he recalls hyperventilating when his head was put in the helmet vise, then he woke up 5 hours later with shoulder pain R>L. He was reportedly foaming at the mouth with tongue bite, with a convulsive seizure lasting a couple of minutes. He had an MRI brain without contrast which I personally reviewed, no acute changes, hippocampi symmetric with no abnormal signal. 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 His wake and sleep EEG was normal. He was discharged home on Keppra but stopped it after a week because it was making him hostile. His mother takes Depakote so he had been taking his mother's Depakote 500mg  BID since then and denies any further convulsions.  He reports recurrent symptoms since 2018 when he started having panic attacks out of nowhere. He would start sweating and fell  weak, like he would fall. Standing in front of a fan seemed to help, it would last 5-6 minutes then recur for several times. He has also been having a sensation running up and down his chest and back, an uncontrollable cool sensation that has quieted down some since starting medication. He recalls an episode at age 6 when he was driving to [facility] and it felt like his whole body paused, then he snapped back with the car still in motion. He attributed this to smoking marijuana. He denies any staring/unresponsive episodes,  gaps in time, olfactory/gustatory hallucinations, focal numbness/tingling/weakness, myoclonic jerks. Since Sunday, he would start having a pounding headache right before he takes his dose of Depakote. He denies any dizziness, diplopia, neck/back pain. No side effects on Depakote.   Epilepsy Risk Factors:  His maternal grandfather, mother, maternal aunt, and maternal cousin have seizures. Otherwise he had a normal birth and early development.  There is no history of febrile convulsions, CNS infections such as meningitis/encephalitis, significant traumatic brain injury, neurosurgical procedures.  Diagnostic Data: MRI brain without contrast in 11/2018 showed no acute changes, hippocampi symmetric with no abnormal signal. 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  His wake and sleep EEG in 11/2018 was normal. 48-hour EEG in 02/2020 showed frequent right anterior temporal epileptiform discharges exclusively in sleep. There were 3 electrographic seizures captured arising from the right temporal region lasting 70 seconds to 2.5 minutes with no clinical symptoms reported.  Prior ASMs: Keppra (hostile)  PAST MEDICAL HISTORY: Past Medical History:  Diagnosis Date   Bipolar disorder (HCC)    Family history of adverse reaction to anesthesia    problem with aunt waking up after surgery   GERD (gastroesophageal reflux disease)    Seizures (HCC)     MEDICATIONS: Current Outpatient Medications on File Prior to Visit  Medication Sig Dispense Refill   cyclobenzaprine (FLEXERIL) 5 MG tablet Take 1 tablet (5 mg total) by mouth every 8 (eight) hours as needed for muscle spasms. 30 tablet 11   divalproex (DEPAKOTE ER) 500 MG 24 hr tablet Take 2 tabs in AM, 3 tabs in PM 450 tablet 3   Pseudoeph-Doxylamine-DM-APAP (NYQUIL PO) Take 1 Dose by mouth daily as needed (Itchy Throat).     No current  facility-administered medications on file prior to visit.    ALLERGIES: Allergies  Allergen Reactions   Tramadol Nausea And Vomiting   Augmentin [Amoxicillin-Pot Clavulanate] Rash and Other (See Comments)    Has patient had a PCN reaction causing immediate rash, facial/tongue/throat swelling, SOB or lightheadedness with hypotension: No Has patient had a PCN reaction causing severe rash involving mucus membranes or skin necrosis: Yes Has patient had a PCN reaction that required hospitalization Yes Has patient had a PCN reaction occurring within the last 10 years: No If all of the above answers are "NO", then may proceed with Cephalosporin use.    Nickel Rash    Burning    Silver Rash    Burning     FAMILY HISTORY: Family History  Problem Relation Age of Onset   Diabetes Mother    Hypertension Mother    Sarcoidosis Mother    Atrial fibrillation Mother    Hypertension Father    Diabetes Maternal Grandfather    Atrial fibrillation Maternal Grandfather    Hypertension Paternal Grandfather    Atrial fibrillation Paternal Grandfather    Parkinson's disease Paternal  Grandfather     SOCIAL HISTORY: Social History   Socioeconomic History   Marital status: Single    Spouse name: Not on file   Number of children: Not on file   Years of education: Not on file   Highest education level: Not on file  Occupational History   Occupation: CNA  Tobacco Use   Smoking status: Former    Current packs/day: 0.00    Types: Cigarettes    Quit date: 01/24/2021    Years since quitting: 2.0   Smokeless tobacco: Never   Tobacco comments:    smokes cigarettes occasionally/ cigars  Vaping Use   Vaping status: Former  Substance and Sexual Activity   Alcohol use: Not Currently    Comment: occasional   Drug use: Yes    Types: Marijuana    Comment: 3 a week   Sexual activity: Not Currently  Other Topics Concern   Not on file  Social History Narrative   Right handed    One level home    Highest level of edu- some college   Caffeine none   Not working    Lives with family   Social Determinants of Health   Financial Resource Strain: Not on file  Food Insecurity: Not on file  Transportation Needs: Not on file  Physical Activity: Not on file  Stress: Not on file  Social Connections: Not on file  Intimate Partner Violence: Not on file     PHYSICAL EXAM: Vitals:   02/16/23 0944  BP: 130/80  Pulse: (!) 58  SpO2: 99%   General: No acute distress Head:  Normocephalic/atraumatic Skin/Extremities: No rash, no edema Neurological Exam: alert and awake. No aphasia or dysarthria. Fund of knowledge is appropriate.  Attention and concentration are normal.   Cranial nerves: Pupils equal, round. Extraocular movements intact. No facial asymmetry.  Motor: Moves all extremities symmetrically x 4 at least anti-gravity. No tremors. He is able to stand slowly with negative Romberg.   IMPRESSION: This is a very pleasant 37 yo RH man with a history of bipolar disorder with right temporal lobe epilepsy. His 48-hour EEG showed frequent right anterior temporal epileptiform discharges exclusively in sleep with 3 electrographic seizures captured arising from the right temporal region lasting 70 seconds to 2.5 minutes with no clinical symptoms reported. He has not been doing well since cluster of seizures in November 2023. Thankfully he has not had any seizures since then, but continues to report frequent falls, neck/back pain, diffuse body pain with burning sensation. Proceed with brain MRI with and without contrast. Continue Depakote 1000mg  in AM, 1500mg  in PM. Check Depakote level, CBC, CMP. We discussed starting Gabapentin 300mg  at bedtime for symptomatic treatment of paresthesias/pain, side effects discussed. We may uptitrate as tolerated. They are requesting a referral to his mother's Orthopedic surgeon for the neck/back pain, Dr. Jerl Santos, referral will be sent. He will also be referred to  Physical therapy for neck/back pain and frequent falls. He is aware of [place] driving laws to stop driving after a seizure until 6 months seizure-free. Follow-up in 1 month, call for any changes.    Thank you for allowing me to participate in his care.  Please do not hesitate to call for any questions or concerns.    Patrcia Dolly, M.D.

## 2023-02-16 NOTE — Patient Instructions (Addendum)
Good to see you.  Proceed with brain MRI with and without contrast  2. Have bloodwork done for CBC, CMP, Depakote level  3. Referral will be sent for Orthopedics, as well as for Physical therapy  4. Start Gabapentin 300mg : take 1 capsule every night  5. Continue Depakote 500: 2 in AM, 3 in PM for now. We may adjust if needed  6. Follow-up in 6 weeks, call for any changes

## 2023-02-17 ENCOUNTER — Encounter: Payer: Self-pay | Admitting: Neurology

## 2023-02-17 ENCOUNTER — Telehealth: Payer: Self-pay

## 2023-02-17 DIAGNOSIS — Z79899 Other long term (current) drug therapy: Secondary | ICD-10-CM

## 2023-02-17 NOTE — Telephone Encounter (Signed)
-----   Message from Van Clines sent at 02/17/2023 12:15 PM EDT ----- Pls let him know electrolytes, kidney, liver function tests normal. Platelet count is higher than last time. Proceed with MRI brain and also return for Depakote level first thing in morning next week. Thanks

## 2023-02-17 NOTE — Telephone Encounter (Signed)
Pt called informed that  electrolytes, kidney, liver function tests normal. Platelet count is higher than last time. Proceed with MRI brain and also return for Depakote level first thing in morning next week

## 2023-02-22 ENCOUNTER — Other Ambulatory Visit (INDEPENDENT_AMBULATORY_CARE_PROVIDER_SITE_OTHER): Payer: BLUE CROSS/BLUE SHIELD

## 2023-02-22 DIAGNOSIS — Z79899 Other long term (current) drug therapy: Secondary | ICD-10-CM

## 2023-02-23 ENCOUNTER — Telehealth: Payer: Self-pay

## 2023-02-23 LAB — VALPROIC ACID LEVEL: Valproic Acid Lvl: 99.7 mg/L (ref 50.0–100.0)

## 2023-02-23 NOTE — Telephone Encounter (Signed)
-----   Message from Van Clines sent at 02/23/2023 11:49 AM EDT ----- Pls let him know Depakote level within range. Proceed with brain MRI, thanks

## 2023-02-23 NOTE — Telephone Encounter (Signed)
Pt called informed that  Depakote level within range. Proceed with brain MRI,

## 2023-03-24 ENCOUNTER — Encounter: Payer: Self-pay | Admitting: Neurology

## 2023-03-26 ENCOUNTER — Other Ambulatory Visit: Payer: BLUE CROSS/BLUE SHIELD

## 2023-03-31 ENCOUNTER — Encounter: Payer: Self-pay | Admitting: Neurology

## 2023-03-31 ENCOUNTER — Ambulatory Visit: Payer: Medicaid Other | Admitting: Neurology

## 2023-03-31 VITALS — BP 125/82 | HR 74 | Ht 77.0 in | Wt 224.6 lb

## 2023-03-31 DIAGNOSIS — R296 Repeated falls: Secondary | ICD-10-CM | POA: Diagnosis not present

## 2023-03-31 DIAGNOSIS — G40009 Localization-related (focal) (partial) idiopathic epilepsy and epileptic syndromes with seizures of localized onset, not intractable, without status epilepticus: Secondary | ICD-10-CM | POA: Diagnosis not present

## 2023-03-31 DIAGNOSIS — R52 Pain, unspecified: Secondary | ICD-10-CM

## 2023-03-31 MED ORDER — GABAPENTIN 300 MG PO CAPS
ORAL_CAPSULE | ORAL | 3 refills | Status: DC
Start: 2023-03-31 — End: 2024-01-08

## 2023-03-31 NOTE — Patient Instructions (Addendum)
I hope you feel better soon.  Increase Gabapentin 300mg : take 1 capsule twice a day for 1 week, then increase to 1 capsule in AM, 2 capsules in PM  2. Continue Depakote 1000mg  in AM, 1500mg  in PM  3. Proceed with PCP, Ortho evaluation and physical therapy  4. Will reorder MRI brain with and without contrast  5. Follow-up in 3 months,call for any changes   Seizure Precautions: 1. If medication has been prescribed for you to prevent seizures, take it exactly as directed.  Do not stop taking the medicine without talking to your doctor first, even if you have not had a seizure in a long time.   2. Avoid activities in which a seizure would cause danger to yourself or to others.  Don't operate dangerous machinery, swim alone, or climb in high or dangerous places, such as on ladders, roofs, or girders.  Do not drive unless your doctor says you may.  3. If you have any warning that you may have a seizure, lay down in a safe place where you can't hurt yourself.    4.  No driving for 6 months from last seizure, as per [facility].   Please refer to the following link on the Epilepsy Foundation of America's website for more information: http://www.epilepsyfoundation.org/answerplace/Social/driving/drivingu.cfm   5.  Maintain good sleep hygiene.  6.  Notify your neurology if you are planning pregnancy or if you become pregnant.  7.  Contact your doctor if you have any problems that may be related to the medicine you are taking.  8.  Call 911 and bring the patient back to the ED if:        A.  The seizure lasts longer than 5 minutes.       B.  The patient doesn't awaken shortly after the seizure  C.  The patient has new problems such as difficulty seeing, speaking or moving  D.  The patient was injured during the seizure  E.  The patient has a temperature over 102 F (39C)  F.  The patient vomited and now is having trouble breathing  Mri will be at [facility] [phone]

## 2023-03-31 NOTE — Progress Notes (Signed)
NEUROLOGY FOLLOW UP OFFICE NOTE  Todd Yoder [id number] 07-29-1985  HISTORY OF PRESENT ILLNESS: I had the pleasure of seeing Todd Yoder in follow-up in the neurology clinic on 03/31/2023. He is again accompanied by his mother who helps supplement the history today. The patient was last seen 6 weeks ago for several symptoms. He has right temporal lobe epilepsy but main concern continues to be frequent falls, diffuse pain, imbalance. MRI has not been approved by his prior insurance. He was started on Gabapentin on last visit for paresthesias, currently on 300mg  qhs without side effects. He was referred to Ortho and physical therapy but has not been able to attend due to prior insurance issues. He is hoping to see Ortho next week. The Gabapentin does help some, but he is still in so much pain that he cannot sleep, which concerns him due to sleep deprivation being a seizure trigger. Most sleep he has is 4 hours. He has not had any seizures since 05/2022, however his mother reports that 4 days ago when he had hardly any sleep, he was trembling and fell and hit his knee. His hands and face were trembling the other night. He falls on a near daily basis, he fell twice yesterday, earlier in the day he turned and fell backwards hitting the wall, then early evening he was turning and fell into the [place] cabinet. No loss of consciousness. He reports a lot of pain, when he leans down, it hurts more, touching himself causes pain. He also reports issues with urination. He will be seeing his PCP in 2 weeks. He continues on Depakote 1000mg  in AM, 1500mg  in PM for seizures, no side effects.   Laboratory Data: Lab Results  Component Value Date   VALPROATE 99.7 02/22/2023   Lab Results  Component Value Date   WBC 5.5 02/16/2023   HGB 13.8 02/16/2023   HCT 42.6 02/16/2023   MCV 93.6 02/16/2023   PLT 122.0 (L) 02/16/2023     Chemistry      Component Value Date/Time   NA 137 02/16/2023 1006   K 4.0  02/16/2023 1006   CL 104 02/16/2023 1006   CO2 26 02/16/2023 1006   BUN 34 (H) 02/16/2023 1006   CREATININE 1.30 02/16/2023 1006   CREATININE 1.12 09/06/2022 1636      Component Value Date/Time   CALCIUM 8.8 02/16/2023 1006   ALKPHOS 47 02/16/2023 1006   AST 26 02/16/2023 1006   ALT 20 02/16/2023 1006   BILITOT 0.4 02/16/2023 1006        History on Initial Assessment 12/18/2018: This is a very pleasant 37 year old right-handed man with a history of bipolar disorder presenting for evaluation of new onset seizures. The first seizure occurred in his sleep on 10/04/2018. He had been sleep deprived for 1-2 days with only 4 hours of sleep, he recalled feeling tired and had a little headache then woke up inside the ambulance. His had bit his lip/tongue. He was brought to [facility] where CBC, BMP, head CT without contrast were unremarkable except for slight cerebellar tonsillar ectopia. UDS positive for THC. He had another seizure on 11/25/2018 again in his sleep. He again had sleep deprivation since they were short staffed at work, he works as a Lawyer. He got off work at [employer] and started moving his things into storage. He had a little nip of scotch and brandy, lay down, then woke up to family pushing him to go to the hospital. If felt  like his muscles had a rigorous workout. He was brought to [facility] and as he was getting an MRI, he recalls hyperventilating when his head was put in the helmet vise, then he woke up 5 hours later with shoulder pain R>L. He was reportedly foaming at the mouth with tongue bite, with a convulsive seizure lasting a couple of minutes. He had an MRI brain without contrast which I personally reviewed, no acute changes, hippocampi symmetric with no abnormal signal. 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 His wake and sleep EEG was normal. He was discharged home on Keppra but stopped it after  a week because it was making him hostile. His mother takes Depakote so he had been taking his mother's Depakote 500mg  BID since then and denies any further convulsions.  He reports recurrent symptoms since 2018 when he started having panic attacks out of nowhere. He would start sweating and fell weak, like he would fall. Standing in front of a fan seemed to help, it would last 5-6 minutes then recur for several times. He has also been having a sensation running up and down his chest and back, an uncontrollable cool sensation that has quieted down some since starting medication. He recalls an episode at age 56 when he was driving to [facility] and it felt like his whole body paused, then he snapped back with the car still in motion. He attributed this to smoking marijuana. He denies any staring/unresponsive episodes, gaps in time, olfactory/gustatory hallucinations, focal numbness/tingling/weakness, myoclonic jerks. Since Sunday, he would start having a pounding headache right before he takes his dose of Depakote. He denies any dizziness, diplopia, neck/back pain. No side effects on Depakote.   Epilepsy Risk Factors:  His maternal grandfather, mother, maternal aunt, and maternal cousin have seizures. Otherwise he had a normal birth and early development.  There is no history of febrile convulsions, CNS infections such as meningitis/encephalitis, significant traumatic brain injury, neurosurgical procedures.  Diagnostic Data: MRI brain without contrast in 11/2018 showed no acute changes, hippocampi symmetric with no abnormal signal. 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  His wake and sleep EEG in 11/2018 was normal. 48-hour EEG in 02/2020 showed frequent right anterior temporal epileptiform discharges exclusively in sleep. There were 3 electrographic seizures captured arising from the right temporal  region lasting 70 seconds to 2.5 minutes with no clinical symptoms reported.  Prior ASMs: Keppra (hostile)  PAST MEDICAL HISTORY: Past Medical History:  Diagnosis Date   Bipolar disorder (HCC)    Family history of adverse reaction to anesthesia    problem with aunt waking up after surgery   GERD (gastroesophageal reflux disease)    Seizures (HCC)     MEDICATIONS: Current Outpatient Medications on File Prior to Visit  Medication Sig Dispense Refill   cyclobenzaprine (FLEXERIL) 5 MG tablet Take 1 tablet (5 mg total) by mouth every 8 (eight) hours as needed for muscle spasms. 30 tablet 11   divalproex (DEPAKOTE ER) 500 MG 24 hr tablet Take 2 tabs in AM, 3 tabs in PM 450 tablet 3   gabapentin (NEURONTIN) 300 MG capsule Take 1 capsule every night 90 capsule 3   Pseudoeph-Doxylamine-DM-APAP (NYQUIL PO) Take 1 Dose by mouth daily as needed (Itchy Throat).     No current facility-administered medications on file prior to  visit.    ALLERGIES: Allergies  Allergen Reactions   Tramadol Nausea And Vomiting   Augmentin [Amoxicillin-Pot Clavulanate] Rash and Other (See Comments)    Has patient had a PCN reaction causing immediate rash, facial/tongue/throat swelling, SOB or lightheadedness with hypotension: No Has patient had a PCN reaction causing severe rash involving mucus membranes or skin necrosis: Yes Has patient had a PCN reaction that required hospitalization Yes Has patient had a PCN reaction occurring within the last 10 years: No If all of the above answers are "NO", then may proceed with Cephalosporin use.    Nickel Rash    Burning    Silver Rash    Burning     FAMILY HISTORY: Family History  Problem Relation Age of Onset   Diabetes Mother    Hypertension Mother    Sarcoidosis Mother    Atrial fibrillation Mother    Hypertension Father    Diabetes Maternal Grandfather    Atrial fibrillation Maternal Grandfather    Hypertension Paternal Grandfather    Atrial  fibrillation Paternal Grandfather    Parkinson's disease Paternal Grandfather     SOCIAL HISTORY: Social History   Socioeconomic History   Marital status: Single    Spouse name: Not on file   Number of children: Not on file   Years of education: Not on file   Highest education level: Not on file  Occupational History   Occupation: CNA  Tobacco Use   Smoking status: Former    Current packs/day: 0.00    Types: Cigarettes    Quit date: 01/24/2021    Years since quitting: 2.1   Smokeless tobacco: Never   Tobacco comments:    smokes cigarettes occasionally/ cigars  Vaping Use   Vaping status: Former  Substance and Sexual Activity   Alcohol use: Not Currently    Comment: occasional   Drug use: Yes    Types: Marijuana    Comment: 3 a week   Sexual activity: Not Currently  Other Topics Concern   Not on file  Social History Narrative   Right handed    One level home   Highest level of edu- some college   Caffeine none   Not working    Lives with family   Social Determinants of Health   Financial Resource Strain: Not on file  Food Insecurity: Not on file  Transportation Needs: Not on file  Physical Activity: Not on file  Stress: Not on file  Social Connections: Not on file  Intimate Partner Violence: Not on file     PHYSICAL EXAM: Vitals:   03/31/23 0906  BP: 125/82  Pulse: 74  SpO2: 98%   General: No acute distress Head:  Normocephalic/atraumatic Skin/Extremities: No rash, no edema Neurological Exam: alert and awake. No aphasia or dysarthria. Fund of knowledge is appropriate. Attention and concentration are normal.   Cranial nerves: Pupils equal, round. Extraocular movements intact with no nystagmus. Visual fields full.  No facial asymmetry.  Motor: Bulk and tone normal, muscle strength 5/5 throughout with no pronator drift. Reflexes +1 both UE, +2 both LE, negative Hoffman sign.  Finger to nose testing intact.  Gait narrow-based and steady, no ataxia. No  tremors in office today.   IMPRESSION: This is a very pleasant 37 yo RH man with a history of bipolar disorder with right temporal lobe epilepsy. His 48-hour EEG showed frequent right anterior temporal epileptiform discharges exclusively in sleep with 3 electrographic seizures captured arising from the right temporal region lasting 70  seconds to 2.5 minutes with no clinical symptoms reported. He denies any typical seizures since 05/2022 on Depakote 1000mg  in AM, 1500mg  in PM however has had frequent falls, imbalance, and diffuse body pain. MRI brain with and without contrast will be ordered to assess for underlying structural abnormality. He is awaiting Ortho and PT appointments with new insurance. We discussed increasing Gabapentin for symptomatic treatment of pain and sleep issues, increase to 300mg  BID for 1 week, then 300mg  in AM, 600mg  in PM. Monitor for side effects. He is aware of [place] driving laws to stop driving after a seizure until 6 months seizure-free. Follow-up in 3 months, call for any changes.      Thank you for allowing me to participate in his care.  Please do not hesitate to call for any questions or concerns.    Patrcia Dolly, M.D.   CC: [facility] and [facility]

## 2023-04-21 ENCOUNTER — Other Ambulatory Visit: Payer: Self-pay | Admitting: Neurology

## 2023-04-24 ENCOUNTER — Encounter: Payer: Self-pay | Admitting: Neurology

## 2023-04-27 ENCOUNTER — Telehealth: Payer: Self-pay | Admitting: Neurology

## 2023-04-27 NOTE — Telephone Encounter (Signed)
Called Pharmacy and they did give him the 270 pills. Called pt back and he said yes they called him back and said they had it. Pt happy

## 2023-04-27 NOTE — Telephone Encounter (Signed)
Patient called stating that the Pharmacy is telling him , he has to wait till November to get RX filled. They do not have the Prescription that states 1 pill in the morning and 2 at night of gabapentin 300mg 

## 2023-04-29 IMAGING — DX DG CHEST 1V PORT
2 series · 2 of 2 positions shown · non-contrast
Comparison: 09/05/2018

CLINICAL DATA: Chest pain and palpitations for 4 months. Seizures
for 2 years. Former smoker.

EXAM:
PORTABLE CHEST 1 VIEW

[chest ap (1 of 2)]
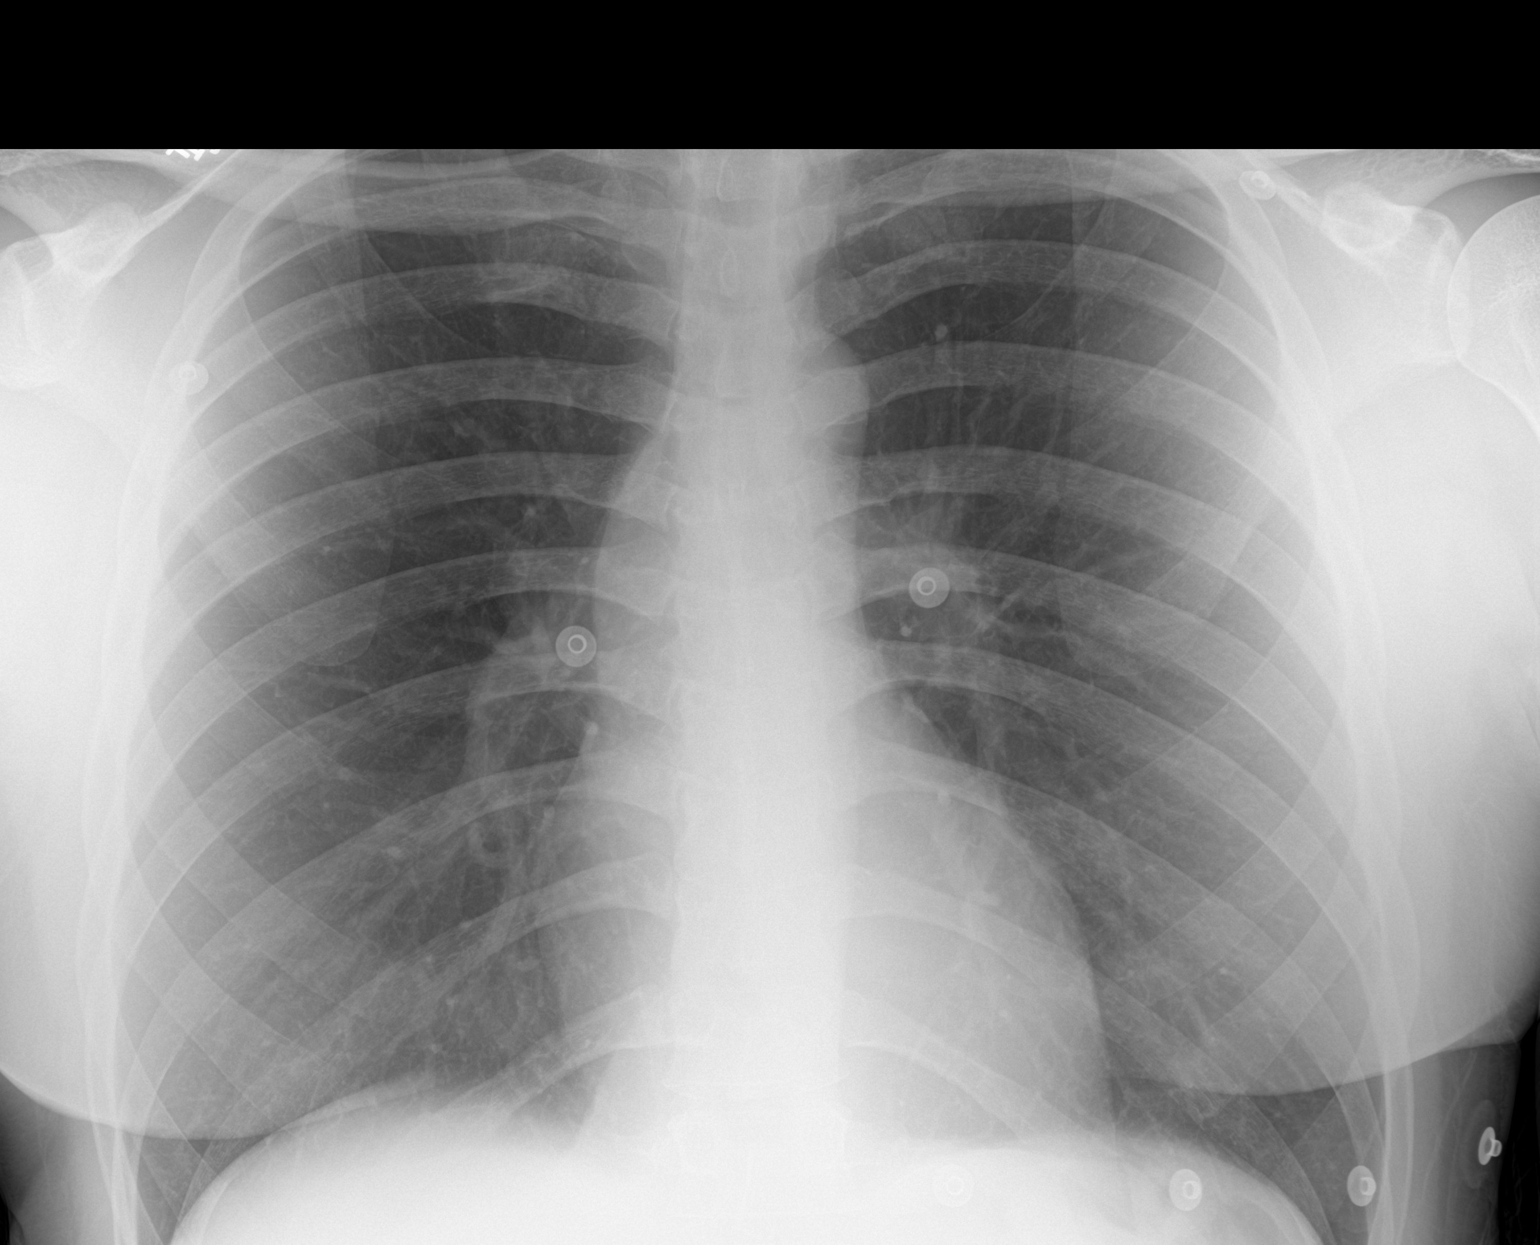

[chest ap (2 of 2)]
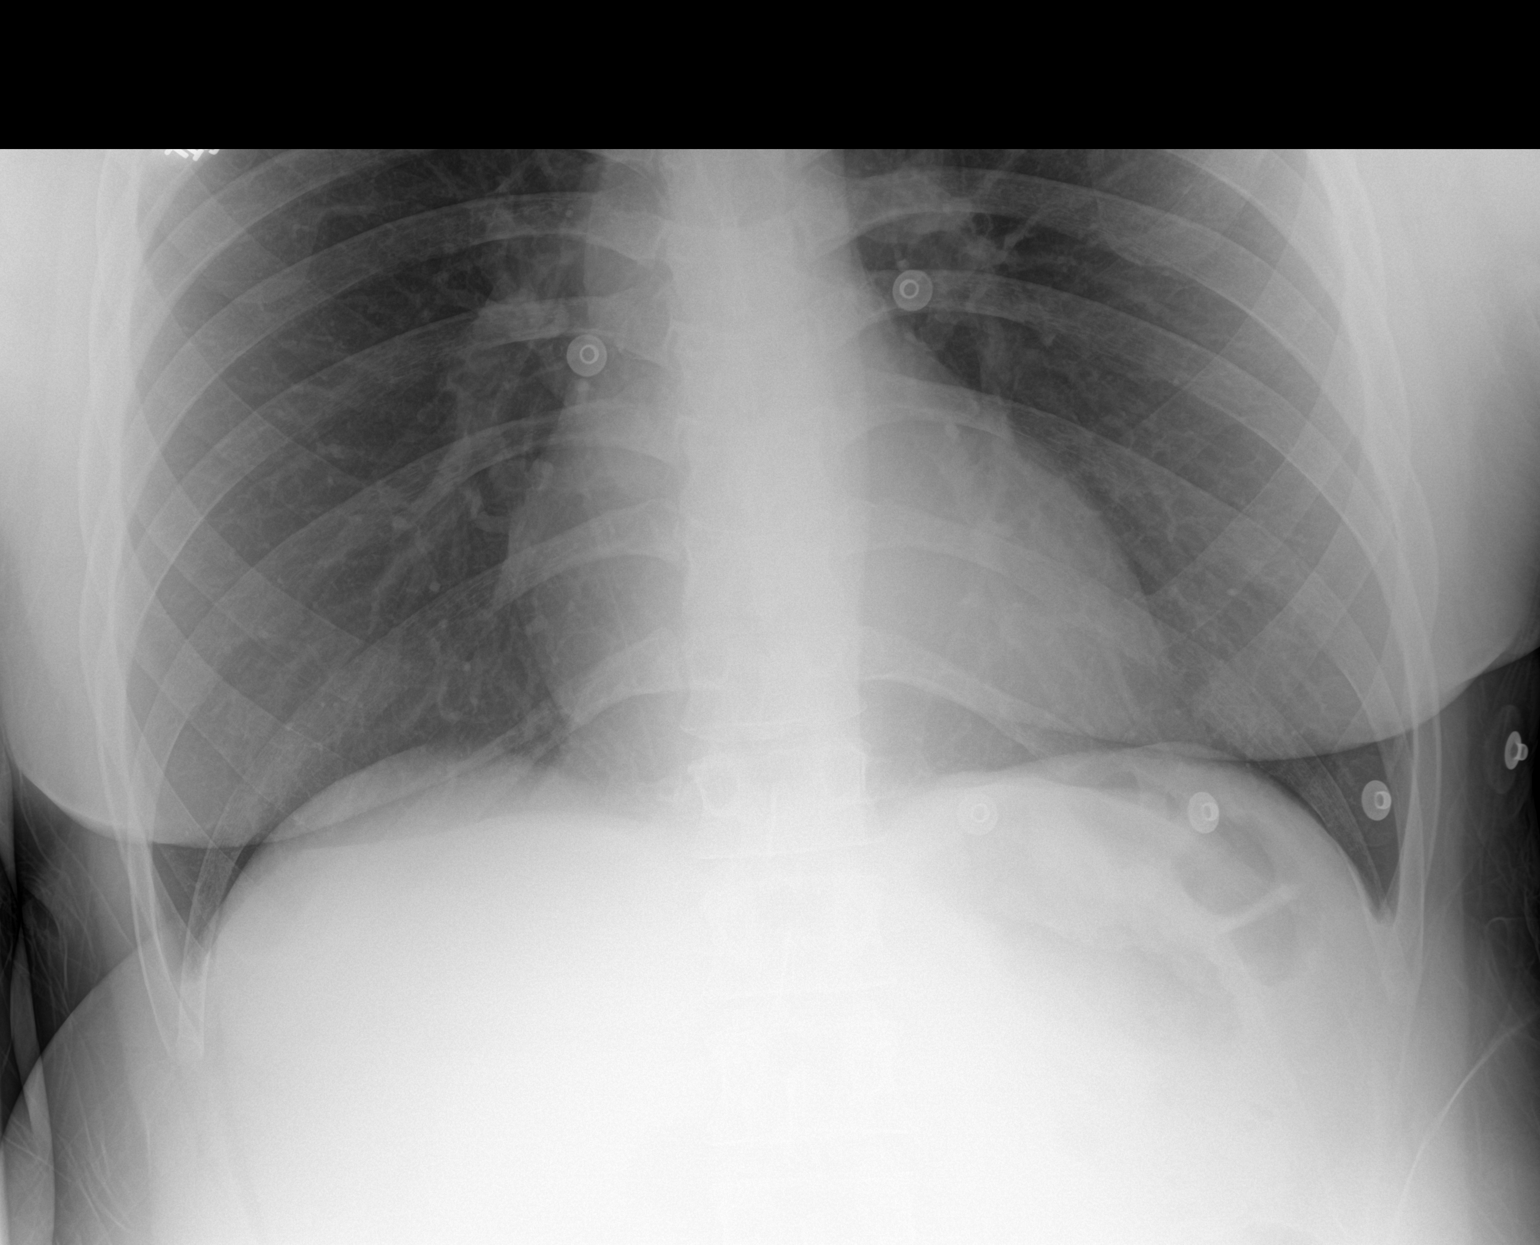

[2 of 2 positions shown; findings below may reference images not displayed]

FINDINGS: The heart size and mediastinal contours are within normal limits.
Both lungs are clear. The visualized skeletal structures are
unremarkable.
IMPRESSION: No active disease.

## 2023-05-19 ENCOUNTER — Telehealth: Payer: Self-pay | Admitting: Neurology

## 2023-05-19 NOTE — Telephone Encounter (Signed)
Patient called stating medicaid denied his MRI 2x due to them not having enough clinical information. He needs aquino to send more documentation so he can get it done. [place] [facility]

## 2023-05-19 NOTE — Telephone Encounter (Signed)
Did he get a new letter? When is it dated? I wrote a letter of appeal for the denials on 04/24/23. I don't see any new denial after the appeal we sent. Thanks

## 2023-05-22 ENCOUNTER — Telehealth: Payer: Self-pay | Admitting: Neurology

## 2023-05-22 NOTE — Telephone Encounter (Signed)
That is the denial that I sent an appeal for on October 14. I don't see any reply from it, do you see any/can you pls f/u on reply? Thank you!

## 2023-05-22 NOTE — Telephone Encounter (Signed)
Pt called to leave message for [facility]. Caller stated that MRI was denied on October 8th

## 2023-05-25 NOTE — Telephone Encounter (Signed)
Pt called an informed that we are waiting to hear back from the appeal,

## 2023-07-11 ENCOUNTER — Encounter: Payer: Self-pay | Admitting: Neurology

## 2023-07-11 ENCOUNTER — Ambulatory Visit: Payer: Medicaid Other | Admitting: Neurology

## 2023-07-11 VITALS — BP 128/79 | HR 84 | Ht 78.5 in | Wt 261.2 lb

## 2023-07-11 DIAGNOSIS — R413 Other amnesia: Secondary | ICD-10-CM | POA: Diagnosis not present

## 2023-07-11 DIAGNOSIS — G40009 Localization-related (focal) (partial) idiopathic epilepsy and epileptic syndromes with seizures of localized onset, not intractable, without status epilepticus: Secondary | ICD-10-CM | POA: Diagnosis not present

## 2023-07-11 DIAGNOSIS — G43009 Migraine without aura, not intractable, without status migrainosus: Secondary | ICD-10-CM

## 2023-07-11 NOTE — Patient Instructions (Signed)
 Always a pleasure to see you. Continue all your medications. Proceed with Ortho testing. Follow-up in 3 months, call for any changes.    Seizure Precautions: 1. If medication has been prescribed for you to prevent seizures, take it exactly as directed.  Do not stop taking the medicine without talking to your doctor first, even if you have not had a seizure in a long time.   2. Avoid activities in which a seizure would cause danger to yourself or to others.  Don't operate dangerous machinery, swim alone, or climb in high or dangerous places, such as on ladders, roofs, or girders.  Do not drive unless your doctor says you may.  3. If you have any warning that you may have a seizure, lay down in a safe place where you can't hurt yourself.    4.  No driving for 6 months from last seizure, as per [place]  state law.   Please refer to the following link on the Epilepsy Foundation of America's website for more information: http://www.epilepsyfoundation.org/answerplace/Social/driving/drivingu.cfm   5.  Maintain good sleep hygiene.  6.  Contact your doctor if you have any problems that may be related to the medicine you are taking.  7.  Call 911 and bring the patient back to the ED if:        A.  The seizure lasts longer than 5 minutes.       B.  The patient doesn't awaken shortly after the seizure  C.  The patient has new problems such as difficulty seeing, speaking or moving  D.  The patient was injured during the seizure  E.  The patient has a temperature over 102 F (39C)  F.  The patient vomited and now is having trouble breathing

## 2023-07-11 NOTE — Progress Notes (Signed)
 NEUROLOGY FOLLOW UP OFFICE NOTE  Todd Yoder [id number] 12-24-1985  HISTORY OF PRESENT ILLNESS: I had the pleasure of seeing Todd Yoder in follow-up in the neurology clinic on 07/11/2023.  The patient was last seen 3 months ago for several symptoms. He is again accompanied by his mother who helps supplement the history today.  Records and images were personally reviewed where available.  Since his last visit, they continue to report memory loss, balance changes, and diffuse pain. He has not had any seizures since 05/2022 on Depakote  1000mg  in AM, 1500mg  in PM. He states I don't think the same way I used to, I feel not as smart. He does not process information as fast as before, and cannot function like before. In the past he would work for 20 hours then get home and do things at home. Now he is in so much pain in his shoulders and back, it is hard to get up and move. He has seen Ortho, awaiting MRI for concern for bursitis. His mother reports he is losing his train of thought, losing things in the midst of telling her something. She would have to remind him about things. He has been having more trembling/hand shaking. He is tolerating the Gabapentin  300mg  in AM, 600mg  in PM with no side effects. He has muscle spasms all day long and states the Gabapentin  helps, he feels a difference when he does not take it. He is also now on Lithium and his mother notes mood is much stabilized. He is working on increasing hydration.    Laboratory Data: Lab Results  Component Value Date   VALPROATE 99.7 02/22/2023   Lab Results  Component Value Date   WBC 5.5 02/16/2023   HGB 13.8 02/16/2023   HCT 42.6 02/16/2023   MCV 93.6 02/16/2023   PLT 122.0 (L) 02/16/2023     Chemistry      Component Value Date/Time   NA 137 02/16/2023 1006   K 4.0 02/16/2023 1006   CL 104 02/16/2023 1006   CO2 26 02/16/2023 1006   BUN 34 (H) 02/16/2023 1006   CREATININE 1.30 02/16/2023 1006   CREATININE 1.12  09/06/2022 1636      Component Value Date/Time   CALCIUM  8.8 02/16/2023 1006   ALKPHOS 47 02/16/2023 1006   AST 26 02/16/2023 1006   ALT 20 02/16/2023 1006   BILITOT 0.4 02/16/2023 1006        History on Initial Assessment 12/18/2018: This is a very pleasant 37 year old right-handed man with a history of bipolar disorder presenting for evaluation of new onset seizures. The first seizure occurred in his sleep on 10/04/2018. He had been sleep deprived for 1-2 days with only 4 hours of sleep, he recalled feeling tired and had a little headache then woke up inside the ambulance. His had bit his lip/tongue. He was brought to [facility] where CBC, BMP, head CT without contrast were unremarkable except for slight cerebellar tonsillar ectopia. UDS positive for THC. He had another seizure on 11/25/2018 again in his sleep. He again had sleep deprivation since they were short staffed at work, he works as a LAWYER. He got off work at [employer] and started moving his things into storage. He had a little nip of scotch and brandy, lay down, then woke up to family pushing him to go to the hospital. If felt like his muscles had a rigorous workout. He was brought to [facility] and as he was getting an MRI, he recalls  hyperventilating when his head was put in the helmet vise, then he woke up 5 hours later with shoulder pain R>L. He was reportedly foaming at the mouth with tongue bite, with a convulsive seizure lasting a couple of minutes. He had an MRI brain without contrast which I personally reviewed, no acute changes, hippocampi symmetric with no abnormal signal. 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 His wake and sleep EEG was normal. He was discharged home on Keppra  but stopped it after a week because it was making him hostile. His mother takes Depakote  so he had been taking his mother's Depakote  500mg  BID since then and denies any  further convulsions.  He reports recurrent symptoms since 2018 when he started having panic attacks out of nowhere. He would start sweating and fell weak, like he would fall. Standing in front of a fan seemed to help, it would last 5-6 minutes then recur for several times. He has also been having a sensation running up and down his chest and back, an uncontrollable cool sensation that has quieted down some since starting medication. He recalls an episode at age 76 when he was driving to [facility] and it felt like his whole body paused, then he snapped back with the car still in motion. He attributed this to smoking marijuana. He denies any staring/unresponsive episodes, gaps in time, olfactory/gustatory hallucinations, focal numbness/tingling/weakness, myoclonic jerks. Since Sunday, he would start having a pounding headache right before he takes his dose of Depakote . He denies any dizziness, diplopia, neck/back pain. No side effects on Depakote .   Epilepsy Risk Factors:  His maternal grandfather, mother, maternal aunt, and maternal cousin have seizures. Otherwise he had a normal birth and early development.  There is no history of febrile convulsions, CNS infections such as meningitis/encephalitis, significant traumatic brain injury, neurosurgical procedures.  Diagnostic Data: MRI brain without contrast in 11/2018 showed no acute changes, hippocampi symmetric with no abnormal signal. 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  His wake and sleep EEG in 11/2018 was normal. 48-hour EEG in 02/2020 showed frequent right anterior temporal epileptiform discharges exclusively in sleep. There were 3 electrographic seizures captured arising from the right temporal region lasting 70 seconds to 2.5 minutes with no clinical symptoms reported.  Prior ASMs: Keppra  (hostile) PAST MEDICAL HISTORY: Past Medical  History:  Diagnosis Date   Bipolar disorder (HCC)    Family history of adverse reaction to anesthesia    problem with aunt waking up after surgery   GERD (gastroesophageal reflux disease)    Seizures (HCC)     MEDICATIONS: Current Outpatient Medications on File Prior to Visit  Medication Sig Dispense Refill   cyclobenzaprine  (FLEXERIL ) 5 MG tablet Take 1 tablet (5 mg total) by mouth every 8 (eight) hours as needed for muscle spasms. 30 tablet 11   divalproex  (DEPAKOTE  ER) 500 MG 24 hr tablet Take 2 tabs in AM, 3 tabs in PM 450 tablet 3   gabapentin  (NEURONTIN ) 300 MG capsule Take 1 capsule in AM, 2 capsules in PM 270 capsule 3   lithium carbonate 300 MG capsule Take 300 mg by mouth 3 (three) times daily.     Pseudoeph-Doxylamine-DM-APAP (NYQUIL PO) Take 1 Dose by mouth daily as needed (Itchy Throat).     No current facility-administered medications on file prior to  visit.    ALLERGIES: Allergies  Allergen Reactions   Brazil Nut (Berthollefia Rosanne)    Cipro  [Ciprofloxacin  Hcl]     Burn with urination    Tramadol  Nausea And Vomiting   Augmentin [Amoxicillin-Pot Clavulanate] Rash and Other (See Comments)    Has patient had a PCN reaction causing immediate rash, facial/tongue/throat swelling, SOB or lightheadedness with hypotension: No Has patient had a PCN reaction causing severe rash involving mucus membranes or skin necrosis: Yes Has patient had a PCN reaction that required hospitalization Yes Has patient had a PCN reaction occurring within the last 10 years: No If all of the above answers are NO, then may proceed with Cephalosporin use.    Nickel Rash    Burning    Silver Rash    Burning     FAMILY HISTORY: Family History  Problem Relation Age of Onset   Diabetes Mother    Hypertension Mother    Sarcoidosis Mother    Atrial fibrillation Mother    Hypertension Father    Diabetes Maternal Grandfather    Atrial fibrillation Maternal Grandfather    Hypertension  Paternal Grandfather    Atrial fibrillation Paternal Grandfather    Parkinson's disease Paternal Grandfather     SOCIAL HISTORY: Social History   Socioeconomic History   Marital status: Single    Spouse name: Not on file   Number of children: Not on file   Years of education: Not on file   Highest education level: Not on file  Occupational History   Occupation: CNA  Tobacco Use   Smoking status: Some Days    Current packs/day: 0.00    Types: Cigarettes    Last attempt to quit: 01/24/2021    Years since quitting: 2.4   Smokeless tobacco: Never   Tobacco comments:    smokes cigarettes occasionally/ cigars  Vaping Use   Vaping status: Former  Substance and Sexual Activity   Alcohol use: Not Currently    Comment: occasional   Drug use: Yes    Types: Marijuana    Comment: 3 a week   Sexual activity: Not Currently  Other Topics Concern   Not on file  Social History Narrative   Right handed    One level home   Highest level of edu- some college   Caffeine none   Not working    Lives with family   Social Drivers of Corporate Investment Banker Strain: Not on file  Food Insecurity: Not on file  Transportation Needs: Not on file  Physical Activity: Not on file  Stress: Not on file  Social Connections: Not on file  Intimate Partner Violence: Not on file     PHYSICAL EXAM: Vitals:   07/11/23 1517  BP: 128/79  Pulse: 84  SpO2: 98%   General: No acute distress Head:  Normocephalic/atraumatic Skin/Extremities: No rash, no edema Neurological Exam: alert and awake. No aphasia or dysarthria. Fund of knowledge is appropriate.Attention and concentration are normal.   Cranial nerves: Pupils equal, round. Extraocular movements intact.  No facial asymmetry.  Motor: moves all extremities at least anti-gravity x 4. Gait narrow-based and steady, no ataxia.    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 His 48-hour EEG showed frequent right anterior temporal epileptiform discharges exclusively in sleep with 3 electrographic seizures captured arising from the right temporal region lasting 70 seconds to 2.5 minutes with no clinical symptoms reported.  They deny any seizures since 05/2022 on Depakote  1000mg  in AM, 1500mg  in PM. He continues to deal with diffuse body pain, as well as memory changes and decreased energy. This may all still be related to his pain, we discussed proceeding with Ortho evaluation and treatment. If no improvement in cognitive changes despite improvement in pain, we will proceed with brain MRI as previously discussed. Continue Gabapentin  300mg  in AM, 600mg  in PM. He is aware of [place] driving laws to stop driving after a seizure until 6 months seizure-free. Follow-up in 3 months, call for any changes.   Thank you for allowing me to participate in his care.  Please do not hesitate to call for any questions or concerns.    Darice Shivers, M.D.   CC: [facility]

## 2023-10-04 ENCOUNTER — Ambulatory Visit: Payer: Medicaid Other | Admitting: Neurology

## 2023-10-04 ENCOUNTER — Encounter: Payer: Self-pay | Admitting: Neurology

## 2023-10-04 VITALS — BP 134/88 | HR 87 | Resp 18 | Wt 269.0 lb

## 2023-10-04 DIAGNOSIS — G40009 Localization-related (focal) (partial) idiopathic epilepsy and epileptic syndromes with seizures of localized onset, not intractable, without status epilepticus: Secondary | ICD-10-CM

## 2023-10-04 DIAGNOSIS — R413 Other amnesia: Secondary | ICD-10-CM | POA: Diagnosis not present

## 2023-10-04 DIAGNOSIS — R296 Repeated falls: Secondary | ICD-10-CM

## 2023-10-04 DIAGNOSIS — R1310 Dysphagia, unspecified: Secondary | ICD-10-CM | POA: Diagnosis not present

## 2023-10-04 NOTE — Progress Notes (Signed)
 NEUROLOGY FOLLOW UP OFFICE NOTE  Todd Yoder [id number] August 06, 1985  HISTORY OF PRESENT ILLNESS: I had the pleasure of seeing Todd Yoder in follow-up in the neurology clinic on 10/04/2023.  The patient was last seen 3 months ago for several symptoms. He is again accompanied by his mother who helps supplement the history today.  Records and images were personally reviewed where available. He has been dealing with memory loss, balance changes, diffuse body pains. Since his last visit, his mother reports 2 seizures in January after being seizure-free for over a year (05/2022). Both were nocturnal, his mother reports they lasted 3-4 minutes. He got off track with his medication due to their travel for his grandfather's illness, then they both had a GI virus with vomiting and diarrhea. He is on  Depakote 1000mg  in AM, 1500mg  in PM. He is also on Gabapentin 300mg  in AM, 600mg  in PM for pain. His mother is concerned that since the seizures in January, his memory is even worse. He forgets what he is going to get or where he puts things. She has to call him frequently to remind him of things. He continues to fall frequently, tripping even on flat surfaces. Most concerning is the swallowing change, he would be chewing but food would be "sliding down" and he would start choking. There was one incident separate from the seizures where he woke up with his pillow having saliva all over. He is in more pain, he reports pain in shoulders, chest, arms, legs, knees. He has been seeing [facility] and reports having imaging of his left shoulder and right knee. He reports his walking is so slow. He fractured his left hand after punching a wall last month when he got angry. He is on Lithium prescribed by PCP for mood stabilization.    History on Initial Assessment 12/18/2018: This is a very pleasant 38 year old right-handed man with a history of bipolar disorder presenting for evaluation of new onset  seizures. The first seizure occurred in his sleep on 10/04/2018. He had been sleep deprived for 1-2 days with only 4 hours of sleep, he recalled feeling tired and had a little headache then woke up inside the ambulance. His had bit his lip/tongue. He was brought to [facility] where CBC, BMP, head CT without contrast were unremarkable except for slight cerebellar tonsillar ectopia. UDS positive for THC. He had another seizure on 11/25/2018 again in his sleep. He again had sleep deprivation since they were short staffed at work, he works as a Lawyer. He got off work at [employer] and started moving his things into storage. He had a little nip of scotch and brandy, lay down, then woke up to family pushing him to go to the hospital. If felt like his muscles had a rigorous workout. He was brought to [facility] and as he was getting an MRI, he recalls hyperventilating when his head was put in the helmet vise, then he woke up 5 hours later with shoulder pain R>L. He was reportedly foaming at the mouth with tongue bite, with a convulsive seizure lasting a couple of minutes. He had an MRI brain without contrast which I personally reviewed, no acute changes, hippocampi symmetric with no abnormal signal. 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 His wake and sleep EEG was normal. He was discharged home on Keppra but stopped it after a week because it was  making him hostile. His mother takes Depakote so he had been taking his mother's Depakote 500mg  BID since then and denies any further convulsions.  He reports recurrent symptoms since 2018 when he started having panic attacks out of nowhere. He would start sweating and fell weak, like he would fall. Standing in front of a fan seemed to help, it would last 5-6 minutes then recur for several times. He has also been having a sensation running up and down his chest and back, an uncontrollable cool  sensation that has quieted down some since starting medication. He recalls an episode at age 9 when he was driving to [facility] and it felt like his whole body paused, then he snapped back with the car still in motion. He attributed this to smoking marijuana. He denies any staring/unresponsive episodes, gaps in time, olfactory/gustatory hallucinations, focal numbness/tingling/weakness, myoclonic jerks. Since Sunday, he would start having a pounding headache right before he takes his dose of Depakote. He denies any dizziness, diplopia, neck/back pain. No side effects on Depakote.   Epilepsy Risk Factors:  His maternal grandfather, mother, maternal aunt, and maternal cousin have seizures. Otherwise he had a normal birth and early development.  There is no history of febrile convulsions, CNS infections such as meningitis/encephalitis, significant traumatic brain injury, neurosurgical procedures.  Diagnostic Data: MRI brain without contrast in 11/2018 showed no acute changes, hippocampi symmetric with no abnormal signal. 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  His wake and sleep EEG in 11/2018 was normal. 48-hour EEG in 02/2020 showed frequent right anterior temporal epileptiform discharges exclusively in sleep. There were 3 electrographic seizures captured arising from the right temporal region lasting 70 seconds to 2.5 minutes with no clinical symptoms reported.  Prior ASMs: Keppra (hostile)  PAST MEDICAL HISTORY: Past Medical History:  Diagnosis Date   Bipolar disorder (HCC)    Family history of adverse reaction to anesthesia    problem with aunt waking up after surgery   GERD (gastroesophageal reflux disease)    Seizures (HCC)     MEDICATIONS: Current Outpatient Medications on File Prior to Visit  Medication Sig Dispense Refill   cyclobenzaprine (FLEXERIL) 5 MG tablet Take 1 tablet (5  mg total) by mouth every 8 (eight) hours as needed for muscle spasms. 30 tablet 11   divalproex (DEPAKOTE ER) 500 MG 24 hr tablet Take 2 tabs in AM, 3 tabs in PM 450 tablet 3   gabapentin (NEURONTIN) 300 MG capsule Take 1 capsule in AM, 2 capsules in PM 270 capsule 3   lithium carbonate 300 MG capsule Take 300 mg by mouth 3 (three) times daily.     Pseudoeph-Doxylamine-DM-APAP (NYQUIL PO) Take 1 Dose by mouth daily as needed (Itchy Throat).     No current facility-administered medications on file prior to visit.    ALLERGIES: Allergies  Allergen Reactions   [place] Nut (Berthollefia Excelsa)    Cipro [Ciprofloxacin Hcl]     Burn with urination    Tramadol Nausea And Vomiting   Augmentin [Amoxicillin-Pot Clavulanate] Rash and Other (See Comments)    Has patient had a PCN reaction causing immediate rash, facial/tongue/throat swelling, SOB or lightheadedness with hypotension: No Has patient had a PCN reaction causing severe rash involving mucus membranes or skin necrosis: Yes Has patient had a PCN reaction that required hospitalization Yes Has patient had a PCN reaction occurring within the last 10 years: No  If all of the above answers are "NO", then may proceed with Cephalosporin use.    Nickel Rash    Burning    Silver Rash    Burning     FAMILY HISTORY: Family History  Problem Relation Age of Onset   Diabetes Mother    Hypertension Mother    Sarcoidosis Mother    Atrial fibrillation Mother    Hypertension Father    Diabetes Maternal Grandfather    Atrial fibrillation Maternal Grandfather    Hypertension Paternal Grandfather    Atrial fibrillation Paternal Grandfather    Parkinson's disease Paternal Grandfather     SOCIAL HISTORY: Social History   Socioeconomic History   Marital status: Single    Spouse name: Not on file   Number of children: Not on file   Years of education: Not on file   Highest education level: Not on file  Occupational History   Occupation:  CNA  Tobacco Use   Smoking status: Some Days    Current packs/day: 0.00    Types: Cigarettes    Last attempt to quit: 01/24/2021    Years since quitting: 2.6   Smokeless tobacco: Never   Tobacco comments:    smokes cigarettes occasionally/ cigars  Vaping Use   Vaping status: Former  Substance and Sexual Activity   Alcohol use: Not Currently    Comment: occasional   Drug use: Yes    Types: Marijuana    Comment: 3 a week   Sexual activity: Not Currently  Other Topics Concern   Not on file  Social History Narrative   Right handed    One level home   Highest level of edu- some college   Caffeine none   Not working    Lives with family   Social Drivers of Corporate investment banker Strain: Not on file  Food Insecurity: Not on file  Transportation Needs: Not on file  Physical Activity: Not on file  Stress: Not on file  Social Connections: Not on file  Intimate Partner Violence: Not on file     PHYSICAL EXAM: Vitals:   10/04/23 1123  BP: 134/88  Pulse: 87  Resp: 18  SpO2: 98%   General: No acute distress Head:  Normocephalic/atraumatic Skin/Extremities: No rash, no edema Neurological Exam: alert and awake. No aphasia or dysarthria. Fund of knowledge is appropriate.  Attention and concentration are normal.   Cranial nerves: Pupils equal, round. Extraocular movements intact. No facial asymmetry.  Motor: no fasciculations seen. Moves all extremities symmetrically x 4 at least anti-gravity. Gait narrow-based and steady, no ataxia.    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 His 48-hour EEG showed frequent right anterior temporal epileptiform discharges exclusively in sleep with 3 electrographic seizures captured arising from the right temporal region lasting 70 seconds to 2.5 minutes with no clinical symptoms reported. He had been seizure-free for over a year until 2 seizures in January in the  setting of GI illness. Since then, he has had worsening memory, more falls, as well as swallowing difficulties. The prolonged course is atypical for post-ictal symptoms, MRI brain with and without contrast will be ordered to assess for underlying structural abnormality. We will do a 1-hour EEG, he will also be scheduled for a swallow evaluation. Proceed with Ortho evaluation, he may benefit from Pain Management referral. Continue Depakote 1000mg  in AM, 1500mg  in PM and Gabapentin 300mg  in PM, 600mg   in PM. He is aware of [place] driving laws to stop driving after a seizure until 6 months seizure-free. Follow-up in 3 months, call for any changes.    Thank you for allowing me to participate in his care.  Please do not hesitate to call for any questions or concerns.    Patrcia Dolly, M.D.   CC: [facility]

## 2023-10-04 NOTE — Patient Instructions (Addendum)
 Good to see you.  Schedule MRI brain with and without contrast  2. Schedule 1-hour EEG  3. Schedule swallow evaluation  4. Continue follow-up with Ortho  5. Follow-up in 3 months, call for any changes   Seizure Precautions: 1. If medication has been prescribed for you to prevent seizures, take it exactly as directed.  Do not stop taking the medicine without talking to your doctor first, even if you have not had a seizure in a long time.   2. Avoid activities in which a seizure would cause danger to yourself or to others.  Don't operate dangerous machinery, swim alone, or climb in high or dangerous places, such as on ladders, roofs, or girders.  Do not drive unless your doctor says you may.  3. If you have any warning that you may have a seizure, lay down in a safe place where you can't hurt yourself.    4.  No driving for 6 months from last seizure, as per [facility]   Please refer to the following link on the Epilepsy Foundation of America's website for more information: http://www.epilepsyfoundation.org/answerplace/Social/driving/drivingu.cfm   5.  Maintain good sleep hygiene. Avoid alcohol.  6.  Contact your doctor if you have any problems that may be related to the medicine you are taking.  7.  Call 911 and bring the patient back to the ED if:        A.  The seizure lasts longer than 5 minutes.       B.  The patient doesn't awaken shortly after the seizure  C.  The patient has new problems such as difficulty seeing, speaking or moving  D.  The patient was injured during the seizure  E.  The patient has a temperature over 102 F (39C)  F.  The patient vomited and now is having trouble breathing

## 2023-10-05 ENCOUNTER — Other Ambulatory Visit (HOSPITAL_COMMUNITY): Payer: Self-pay | Admitting: *Deleted

## 2023-10-05 ENCOUNTER — Ambulatory Visit: Payer: Medicaid Other | Admitting: Neurology

## 2023-10-05 DIAGNOSIS — R1310 Dysphagia, unspecified: Secondary | ICD-10-CM

## 2023-10-06 ENCOUNTER — Telehealth: Payer: Self-pay | Admitting: Neurology

## 2023-10-06 NOTE — Telephone Encounter (Signed)
 Called and spoke to patients mother and informed her to reach out to PCP for referral because there was no discussion about Cardiac symptoms with Dr. Karel Jarvis.  Patients mother verbalized understanding and had no questions or concerns.

## 2023-10-06 NOTE — Telephone Encounter (Signed)
 Pt's mother called in and left a message. She is wanting to get a referral to [facility] (Fax [phone]).

## 2023-10-06 NOTE — Telephone Encounter (Signed)
 It will have to be from his PCP, we have not talked about any cardiac symptoms. Thanks

## 2023-10-16 ENCOUNTER — Ambulatory Visit (HOSPITAL_COMMUNITY)
Admission: RE | Admit: 2023-10-16 | Discharge: 2023-10-16 | Disposition: A | Discharge status: Home / Self Care | Source: Ambulatory Visit | Attending: *Deleted | Admitting: *Deleted

## 2023-10-16 ENCOUNTER — Ambulatory Visit (HOSPITAL_COMMUNITY)

## 2023-10-16 DIAGNOSIS — R1310 Dysphagia, unspecified: Secondary | ICD-10-CM

## 2023-10-17 ENCOUNTER — Other Ambulatory Visit

## 2023-10-24 ENCOUNTER — Telehealth (HOSPITAL_COMMUNITY): Payer: Self-pay | Admitting: Neurology

## 2023-10-24 NOTE — Telephone Encounter (Signed)
 Attempted to reach pt by phone to help reschedule swallow study. No option to leave VM. AHARRIS

## 2023-11-03 ENCOUNTER — Encounter: Payer: Self-pay | Admitting: Neurology

## 2023-11-04 ENCOUNTER — Other Ambulatory Visit

## 2023-11-06 ENCOUNTER — Other Ambulatory Visit: Payer: Self-pay

## 2023-11-06 DIAGNOSIS — R1310 Dysphagia, unspecified: Secondary | ICD-10-CM

## 2023-11-09 ENCOUNTER — Ambulatory Visit: Admitting: Neurology

## 2023-11-09 DIAGNOSIS — R413 Other amnesia: Secondary | ICD-10-CM

## 2023-11-09 DIAGNOSIS — G40009 Localization-related (focal) (partial) idiopathic epilepsy and epileptic syndromes with seizures of localized onset, not intractable, without status epilepticus: Secondary | ICD-10-CM

## 2023-11-15 NOTE — Procedures (Signed)
 ELECTROENCEPHALOGRAM REPORT  Date of Study: 11/09/2023  Patient's Name: Todd Yoder MRN: [id number] Date of Birth: 27-Apr-1986  Referring Provider: Dr. 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 EEG to assess for subclinical seizures.  Medications: Depakote , Gabapentin , Lithium  Technical Summary: A multichannel digital 1-hour EEG recording measured by the international 10-20 system with electrodes applied with paste and impedances below 5000 ohms performed in our laboratory with EKG monitoring in an awake and asleep patient.  Hyperventilation was not performed. Photic stimulation was performed.  The digital EEG was referentially recorded, reformatted, and digitally filtered in a variety of bipolar and referential montages for optimal display.    Description: The patient is predominantly drowsy and asleep during the recording.  During brief period of wakefulness, there is a symmetric, medium voltage 9-10 Hz posterior dominant rhythm that attenuates with eye opening.  The record is symmetric.  During drowsiness and sleep, there is an increase in theta slowing of the background.  Vertex waves and symmetric sleep spindles were seen. Photic stimulation did not elicit any abnormalities.  There were frequent right frontotemporal sharp waves seen in sleep. No electrographic seizures seen.    EKG lead was unremarkable.  Impression: This 1-hour awake and asleep EEG is abnormal due to the presence of frequent right frontotemporal epileptiform discharges in sleep.  Clinical Correlation of the above findings indicates a tendency for seizures to arise from the right frontotemporal region. No subclinical seizures seen. If further clinical questions remain, prolonged EEG may be helpful.  Clinical correlation is advised.   Rayfield Cairo, M.D.

## 2023-11-16 ENCOUNTER — Ambulatory Visit (HOSPITAL_COMMUNITY)
Admission: RE | Admit: 2023-11-16 | Discharge: 2023-11-16 | Disposition: A | Discharge status: Home / Self Care | Source: Ambulatory Visit | Attending: Neurology | Admitting: Neurology

## 2023-11-16 ENCOUNTER — Ambulatory Visit (HOSPITAL_COMMUNITY)
Admission: RE | Admit: 2023-11-16 | Discharge: 2023-11-16 | Disposition: A | Discharge status: Home / Self Care | Source: Ambulatory Visit | Attending: *Deleted | Admitting: *Deleted

## 2023-11-16 DIAGNOSIS — R1310 Dysphagia, unspecified: Secondary | ICD-10-CM | POA: Diagnosis present

## 2023-11-16 DIAGNOSIS — R1312 Dysphagia, oropharyngeal phase: Secondary | ICD-10-CM | POA: Insufficient documentation

## 2023-11-16 NOTE — Progress Notes (Signed)
 Modified Barium Swallow Study  Patient Details  Name: Todd Yoder MRN: [id number] Date of Birth: August 08, 1985  Today's Date: 11/16/2023  Modified Barium Swallow completed.  Full report located under Chart Review in the Imaging Section.  History of Present Illness Referred from neurologist d/t concerns with changes related to swallowing. Pt with PMHx significant for R temporal lobe epilepsy, bipolar.  Pt endorses choking with liquids, sensation that he has to swallow "in parts" and fear of swallowing. He denies sensation of residue or globus. Is not avoiding any foods but is trying to slow down and use smaller bites. Does admit that using these strategies has been challenging.   Clinical Impression Pt presents with overall functional oropharyngeal swallow with intact safety. Pt does demonstrate impairments in swallow efficacy with pt consistently evidenced slowed and reduced base of tongue retraction. Swallow initiation is delayed. Pt noted to push bolus posteriorly into pharynx, with it pooling in valleculae then spilling into pyriform sinuses prior to swallow initiation. Pt has usual oral residue which is intermittently managed via volitional re-swallow. There are times when residue is propelled posteriorly to pool in valleculae and no attempt is made to swallow, indicating potential reduced sensorium. Though not demonstrated this date, pt could be at risk of aspiration, with swallow delay and pharyngeal residue. Brief esophageal screen unremarkable. Pt may benefit from OP ST to address deficits demonstrated. SLP provided pt education on MBSS results and recommendations for use of multiple swallow per bolus, small bites/sips, take time while eating, reduce distractions during meal time. Pt verbalized understanding, no questions at conclusion of session. Factors that may increase risk of adverse event in presence of aspiration Todd Yoder & Todd Yoder 2021):  (none noted)  Swallow Evaluation  Recommendations Recommendations: PO diet PO Diet Recommendation: Regular;Thin liquids (Level 0) Liquid Administration via: Cup;Straw Medication Administration: Whole meds with liquid Supervision: Patient able to self-feed Oral care recommendations: Oral care BID (2x/day)      Todd Yoder 11/16/2023,7:32 PM

## 2023-11-28 ENCOUNTER — Telehealth: Payer: Self-pay

## 2023-11-28 NOTE — Telephone Encounter (Signed)
 PA approved [id number] 11/03/23-01/02/24 message sent to [place] [facility]

## 2023-11-29 ENCOUNTER — Ambulatory Visit: Payer: Self-pay | Admitting: Neurology

## 2023-11-29 DIAGNOSIS — R1310 Dysphagia, unspecified: Secondary | ICD-10-CM

## 2023-11-30 NOTE — Telephone Encounter (Signed)
 Pt called an informed that  the swallow evaluation recommended speech therapy for dysphagia,

## 2023-12-12 ENCOUNTER — Ambulatory Visit
Admission: RE | Admit: 2023-12-12 | Discharge: 2023-12-12 | Disposition: A | Discharge status: Home / Self Care | Source: Ambulatory Visit | Attending: Neurology | Admitting: Neurology

## 2023-12-12 DIAGNOSIS — R1310 Dysphagia, unspecified: Secondary | ICD-10-CM

## 2023-12-12 DIAGNOSIS — R296 Repeated falls: Secondary | ICD-10-CM

## 2023-12-12 DIAGNOSIS — R413 Other amnesia: Secondary | ICD-10-CM

## 2023-12-12 DIAGNOSIS — G40009 Localization-related (focal) (partial) idiopathic epilepsy and epileptic syndromes with seizures of localized onset, not intractable, without status epilepticus: Secondary | ICD-10-CM

## 2023-12-12 MED ORDER — GADOPICLENOL 0.5 MMOL/ML IV SOLN
10.0000 mL | Freq: Once | INTRAVENOUS | Status: AC | PRN
  Administered 2023-12-12: 10 mL via INTRAVENOUS

## 2024-01-01 ENCOUNTER — Ambulatory Visit: Payer: Self-pay | Admitting: Neurology

## 2024-01-08 ENCOUNTER — Ambulatory Visit: Admitting: Neurology

## 2024-01-08 ENCOUNTER — Encounter: Payer: Self-pay | Admitting: Neurology

## 2024-01-08 VITALS — BP 120/81 | HR 71 | Ht 78.5 in | Wt 264.2 lb

## 2024-01-08 DIAGNOSIS — R52 Pain, unspecified: Secondary | ICD-10-CM | POA: Diagnosis not present

## 2024-01-08 DIAGNOSIS — G40009 Localization-related (focal) (partial) idiopathic epilepsy and epileptic syndromes with seizures of localized onset, not intractable, without status epilepticus: Secondary | ICD-10-CM | POA: Diagnosis not present

## 2024-01-08 MED ORDER — DIVALPROEX SODIUM ER 500 MG PO TB24: ORAL_TABLET | ORAL | 3 refills | Status: DC

## 2024-01-08 MED ORDER — GABAPENTIN 300 MG PO CAPS: ORAL_CAPSULE | ORAL | 3 refills | Status: DC

## 2024-01-08 NOTE — Progress Notes (Signed)
 NEUROLOGY FOLLOW UP OFFICE NOTE  Todd Yoder [id number] 10-Nov-1985  HISTORY OF PRESENT ILLNESS: I had the pleasure of seeing Rashaan Wyles in follow-up in the neurology clinic on 01/08/2024.  The patient was last seen 3 months ago for several symptoms. He is again accompanied by his mother who helps supplement the history today.  Records and images were personally reviewed where available.  I personally reviewed MRI brain with and without contrast done 12/2023 which was normal. His 1-hour EEG in 11/2023 showed frequent right frontotemporal epileptiform discharges in sleep. He was reporting swallowing difficulties and had a swallow evaluation in 11/2023 with overall functional oropharyngeal swallow. There was impairment in swallow efficacy with slowed and reduced base of tongue retraction, outpatient speech therapy was recommended. He is still having difficulty with choking and has not seen ST yet.  Since his last visit, they deny any seizures since January 2025. He continues on Depakote  1000mg  in AM, 1500mg  in PM. He has a lot of pain and takes Gabpaentin 300mg  in AM, 600mg  in PM without side effects. He continues to see Ortho, he states he was hurting for 2 days after the MRI. They report he is supposed to start PT for his shoulder, but Ortho also wants him to see a rheumatologist before seeing Pain Management. They reports quite a bit of stress ongoing currently, his grandfather is pretty sick, which stresses him out. The Lithium does help make days brighter. He continues to have falls, he had 2 falls last week, no lost of consciousness. He states that falls happen out of nowhere, he loses his balance. His mother feels he rushes too much, he states maybe I need to slow down.    History on Initial Assessment 12/18/2018: This is a very pleasant 38 year old right-handed man with a history of bipolar disorder presenting for evaluation of new onset seizures. The first seizure occurred in his sleep on  10/04/2018. He had been sleep deprived for 1-2 days with only 4 hours of sleep, he recalled feeling tired and had a little headache then woke up inside the ambulance. His had bit his lip/tongue. He was brought to [facility] where CBC, BMP, head CT without contrast were unremarkable except for slight cerebellar tonsillar ectopia. UDS positive for THC. He had another seizure on 11/25/2018 again in his sleep. He again had sleep deprivation since they were short staffed at work, he works as a Lawyer. He got off work at [employer] and started moving his things into storage. He had a little nip of scotch and brandy, lay down, then woke up to family pushing him to go to the hospital. If felt like his muscles had a rigorous workout. He was brought to [facility] and as he was getting an MRI, he recalls hyperventilating when his head was put in the helmet vise, then he woke up 5 hours later with shoulder pain R>L. He was reportedly foaming at the mouth with tongue bite, with a convulsive seizure lasting a couple of minutes. He had an MRI brain without contrast which I personally reviewed, no acute changes, hippocampi symmetric with no abnormal signal. 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 His wake and sleep EEG was normal. He was discharged home on Keppra  but stopped it after a week because it was making him hostile. His mother takes Depakote  so he had been taking his mother's Depakote  500mg  BID since then  and denies any further convulsions.  He reports recurrent symptoms since 2018 when he started having panic attacks out of nowhere. He would start sweating and fell weak, like he would fall. Standing in front of a fan seemed to help, it would last 5-6 minutes then recur for several times. He has also been having a sensation running up and down his chest and back, an uncontrollable cool sensation that has quieted down some since starting  medication. He recalls an episode at age 38 when he was driving to [facility] and it felt like his whole body paused, then he snapped back with the car still in motion. He attributed this to smoking marijuana. He denies any staring/unresponsive episodes, gaps in time, olfactory/gustatory hallucinations, focal numbness/tingling/weakness, myoclonic jerks. Since Sunday, he would start having a pounding headache right before he takes his dose of Depakote . He denies any dizziness, diplopia, neck/back pain. No side effects on Depakote .   Epilepsy Risk Factors:  His maternal grandfather, mother, maternal aunt, and maternal cousin have seizures. Otherwise he had a normal birth and early development.  There is no history of febrile convulsions, CNS infections such as meningitis/encephalitis, significant traumatic brain injury, neurosurgical procedures.  Diagnostic Data: MRI brain without contrast in 11/2018 showed no acute changes, hippocampi symmetric with no abnormal signal. 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  His wake and sleep EEG in 11/2018 was normal. 48-hour EEG in 02/2020 showed frequent right anterior temporal epileptiform discharges exclusively in sleep. There were 3 electrographic seizures captured arising from the right temporal region lasting 70 seconds to 2.5 minutes with no clinical symptoms reported.  Prior ASMs: Keppra  (hostile)  PAST MEDICAL HISTORY: Past Medical History:  Diagnosis Date   Bipolar disorder (HCC)    Family history of adverse reaction to anesthesia    problem with aunt waking up after surgery   GERD (gastroesophageal reflux disease)    Seizures (HCC)     MEDICATIONS: Current Outpatient Medications on File Prior to Visit  Medication Sig Dispense Refill   cyclobenzaprine  (FLEXERIL ) 5 MG tablet Take 1 tablet (5 mg total) by mouth every 8 (eight) hours as needed  for muscle spasms. 30 tablet 11   divalproex  (DEPAKOTE  ER) 500 MG 24 hr tablet Take 2 tabs in AM, 3 tabs in PM 450 tablet 3   gabapentin  (NEURONTIN ) 300 MG capsule Take 1 capsule in AM, 2 capsules in PM 270 capsule 3   lithium carbonate 300 MG capsule Take 300 mg by mouth 3 (three) times daily. (Patient taking differently: Take 300 mg by mouth 3 (three) times daily. 1 in the am, 1 at noon, 2 at night)     Pseudoeph-Doxylamine-DM-APAP (NYQUIL PO) Take 1 Dose by mouth daily as needed (Itchy Throat).     No current facility-administered medications on file prior to visit.    ALLERGIES: Allergies  Allergen Reactions   [place] Nut (Berthollefia Rosanne)    Cipro  [Ciprofloxacin  Hcl]     Burn with urination    Tramadol  Nausea And Vomiting   Augmentin [Amoxicillin-Pot Clavulanate] Rash and Other (See Comments)    Has patient had a PCN reaction causing immediate rash, facial/tongue/throat swelling, SOB or lightheadedness with hypotension: No Has patient had a PCN reaction causing severe rash involving mucus membranes or skin necrosis: Yes Has patient had a PCN reaction that required hospitalization Yes Has patient had a PCN reaction occurring within the last  10 years: No If all of the above answers are NO, then may proceed with Cephalosporin use.    Nickel Rash    Burning    Silver Rash    Burning     FAMILY HISTORY: Family History  Problem Relation Age of Onset   Diabetes Mother    Hypertension Mother    Sarcoidosis Mother    Atrial fibrillation Mother    Hypertension Father    Diabetes Maternal Grandfather    Atrial fibrillation Maternal Grandfather    Hypertension Paternal Grandfather    Atrial fibrillation Paternal Grandfather    Parkinson's disease Paternal Grandfather     SOCIAL HISTORY: Social History   Socioeconomic History   Marital status: Single    Spouse name: Not on file   Number of children: Not on file   Years of education: Not on file   Highest education  level: Not on file  Occupational History   Occupation: CNA  Tobacco Use   Smoking status: Some Days    Current packs/day: 0.00    Types: Cigarettes    Last attempt to quit: 01/24/2021    Years since quitting: 2.9   Smokeless tobacco: Never   Tobacco comments:    smokes cigarettes occasionally/ cigars  Vaping Use   Vaping status: Former  Substance and Sexual Activity   Alcohol use: Not Currently    Comment: occasional   Drug use: Yes    Types: Marijuana    Comment: 3 a week   Sexual activity: Not Currently  Other Topics Concern   Not on file  Social History Narrative   Right handed    One level home   Highest level of edu- some college   Caffeine none   Not working    Lives with family   Social Drivers of Corporate investment banker Strain: Not on file  Food Insecurity: Not on file  Transportation Needs: Not on file  Physical Activity: Not on file  Stress: Not on file  Social Connections: Not on file  Intimate Partner Violence: Not on file     PHYSICAL EXAM: Vitals:   01/08/24 1545  BP: 120/81  Pulse: 71  SpO2: 98%   General: No acute distress Head:  Normocephalic/atraumatic Skin/Extremities: No rash, no edema Neurological Exam: alert and awake. No aphasia or dysarthria. Fund of knowledge is appropriate.  Attention and concentration are normal.   Cranial nerves: Pupils equal, round. Extraocular movements intact with no nystagmus. Visual fields full.  No facial asymmetry.  Motor: Bulk and tone normal, muscle strength 5/5 throughout with no pronator drift.   Finger to nose testing intact.  Gait narrow-based and steady, no ataxia. Romberg negative.   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 His 48-hour EEG showed frequent right anterior temporal epileptiform discharges exclusively in sleep with 3 electrographic seizures captured arising from the right temporal region lasting 70 seconds to  2.5 minutes with no clinical symptoms reported. He was seizure-free for over a year until January 2025, none since then. He has reported worsening memory, increased falls, and swallowing difficulties. Repeat MRI brain normal. We discussed how pain and stress can affect cognition. Increase Gabapentin  to 600mg  BID, continue follow-up with Ortho and plans for Rheum evaluation. Continue Depakote  1000mg  in AM, 1500mg  in PM for seizure prophylaxis. He was advised to call Speech therapy as previously discussed. He is aware of [place] driving laws to stop  driving after a seizure until 6 months seizure-free. Follow-up in 4-5 months, call for any changes.    Thank you for allowing me to participate in his care.  Please do not hesitate to call for any questions or concerns.    Darice Shivers, M.D.

## 2024-01-08 NOTE — Patient Instructions (Addendum)
 Always good to see you.  Increase Gabapentin  300mg : take 2 capsules twice a day  2. Continue Depakote  1000mg  in AM, 1500mg  in PM  3. Please call the Speech therapy department to schedule appointment  [phone]  4. Continue follow-up with Ortho, proceed with Rheumatology evaluation  5. Follow-up in 4-5 months, call for any changes   Seizure Precautions: 1. If medication has been prescribed for you to prevent seizures, take it exactly as directed.  Do not stop taking the medicine without talking to your doctor first, even if you have not had a seizure in a long time.   2. Avoid activities in which a seizure would cause danger to yourself or to others.  Don't operate dangerous machinery, swim alone, or climb in high or dangerous places, such as on ladders, roofs, or girders.  Do not drive unless your doctor says you may.  3. If you have any warning that you may have a seizure, lay down in a safe place where you can't hurt yourself.    4.  No driving for 6 months from last seizure, as per [place]  state law.   Please refer to the following link on the Epilepsy Foundation of America's website for more information: http://www.epilepsyfoundation.org/answerplace/Social/driving/drivingu.cfm   5.  Maintain good sleep hygiene. Avoid alcohol.  6.  Contact your doctor if you have any problems that may be related to the medicine you are taking.  7.  Call 911 and bring the patient back to the ED if:        A.  The seizure lasts longer than 5 minutes.       B.  The patient doesn't awaken shortly after the seizure  C.  The patient has new problems such as difficulty seeing, speaking or moving  D.  The patient was injured during the seizure  E.  The patient has a temperature over 102 F (39C)  F.  The patient vomited and now is having trouble breathing

## 2024-01-09 ENCOUNTER — Encounter: Payer: Self-pay | Admitting: Cardiology

## 2024-01-09 ENCOUNTER — Ambulatory Visit: Attending: Cardiology

## 2024-01-09 ENCOUNTER — Ambulatory Visit: Attending: Cardiology | Admitting: Cardiology

## 2024-01-09 VITALS — BP 121/78 | HR 67 | Resp 16 | Ht 78.0 in | Wt 262.6 lb

## 2024-01-09 DIAGNOSIS — R002 Palpitations: Secondary | ICD-10-CM | POA: Insufficient documentation

## 2024-01-09 DIAGNOSIS — R42 Dizziness and giddiness: Secondary | ICD-10-CM | POA: Diagnosis present

## 2024-01-09 NOTE — Progress Notes (Unsigned)
 Enrolled patient for a 14 day Zio XT  monitor to be mailed to patients home

## 2024-01-09 NOTE — Patient Instructions (Addendum)
 Medication Instructions:  Your physician recommends that you continue on your current medications as directed. Please refer to the Current Medication list given to you today.  *If you need a refill on your cardiac medications before your next appointment, please call your pharmacy*  Lab Work: none If you have labs (blood work) drawn today and your tests are completely normal, you will receive your results only by: MyChart Message (if you have MyChart) OR A paper copy in the mail If you have any lab test that is abnormal or we need to change your treatment, we will call you to review the results.  Testing/Procedures: Dr Ladona has requested you wear a 2 week zio monitor  Follow-Up: At [facility], you and your health needs are our priority.  As part of our continuing mission to provide you with exceptional heart care, our providers are all part of one team.  This team includes your primary Cardiologist (physician) and Advanced Practice Providers or APPs (Physician Assistants and Nurse Practitioners) who all work together to provide you with the care you need, when you need it.  Your next appointment:   4-5  week(s)  Provider:   One of our Advanced Practice Providers (APPs): Morse Clause, PA-C  Lamarr Satterfield, NP Miriam Shams, NP  Olivia Pavy, PA-C Josefa Beauvais, NP  Leontine Salen, PA-C Orren Fabry, PA-C  [place], PA-C Ernest Dick, NP  Damien Braver, NP Jon Hails, PA-C  Waddell Donath, PA-C    Dayna Dunn, PA-C  Scott Weaver, PA-C Lum Louis, NP Katlyn West, NP Callie Goodrich, PA-C  Evan Williams, PA-C Sheng Haley, PA-C  Xika Zhao, NP Kathleen Johnson, PA-C       We recommend signing up for the patient portal called MyChart.  Sign up information is provided on this After Visit Summary.  MyChart is used to connect with patients for Virtual Visits (Telemedicine).  Patients are able to view lab/test results, encounter notes, upcoming appointments, etc.   Non-urgent messages can be sent to your provider as well.   To learn more about what you can do with MyChart, go to [email or web address].   Other Instructions  ZIO XT- Long Term Monitor Instructions  Your physician has requested you wear a ZIO patch monitor for 14 days.  This is a single patch monitor. [employer] supplies one patch monitor per enrollment. Additional stickers are not available. Please do not apply patch if you will be having a Nuclear Stress Test,  Echocardiogram, Cardiac CT, MRI, or Chest Xray during the period you would be wearing the  monitor. The patch cannot be worn during these tests. You cannot remove and re-apply the  ZIO XT patch monitor.  Your ZIO patch monitor will be mailed 3 day USPS to your address on file. It may take 3-5 days  to receive your monitor after you have been enrolled.  Once you have received your monitor, please review the enclosed instructions. Your monitor  has already been registered assigning a specific monitor serial # to you.  Billing and Patient Assistance Program Information  We have supplied [employer] with any of your insurance information on file for billing purposes. [employer] offers a sliding scale Patient Assistance Program for patients that do not have  insurance, or whose insurance does not completely cover the cost of the ZIO monitor.  You must apply for the Patient Assistance Program to qualify for this discounted rate.  To apply, please call [employer] at [phone], select option 4, select option 2, ask to  apply for  Patient Assistance Program. Meredeth will ask your household income, and how many people  are in your household. They will quote your out-of-pocket cost based on that information.  [employer] will also be able to set up a 33-month, interest-free payment plan if needed.  Applying the monitor   Shave hair from upper left chest.  Hold abrader disc by orange tab. Rub abrader in 40 strokes over the upper left chest as   indicated in your monitor instructions.  Clean area with 4 enclosed alcohol pads. Let dry.  Apply patch as indicated in monitor instructions. Patch will be placed under collarbone on left  side of chest with arrow pointing upward.  Rub patch adhesive wings for 2 minutes. Remove white label marked 1. Remove the white  label marked 2. Rub patch adhesive wings for 2 additional minutes.  While looking in a mirror, press and release button in center of patch. A small green light will  flash 3-4 times. This will be your only indicator that the monitor has been turned on.  Do not shower for the first 24 hours. You may shower after the first 24 hours.  Press the button if you feel a symptom. You will hear a small click. Record Date, Time and  Symptom in the Patient Logbook.  When you are ready to remove the patch, follow instructions on the last 2 pages of Patient  Logbook. Stick patch monitor onto the last page of Patient Logbook.  Place Patient Logbook in the blue and white box. Use locking tab on box and tape box closed  securely. The blue and white box has prepaid postage on it. Please place it in the mailbox as  soon as possible. Your physician should have your test results approximately 7 days after the  monitor has been mailed back to [facility].  Call [facility] Customer Care at [phone] if you have questions regarding  your ZIO XT patch monitor. Call them immediately if you see an orange light blinking on your  monitor.  If your monitor falls off in less than 4 days, contact our Monitor department at [phone].  If your monitor becomes loose or falls off after 4 days call [employer] at [phone] for  suggestions on securing your monitor

## 2024-01-09 NOTE — Progress Notes (Signed)
Cardiology Office Note:  .   Date:  01/09/2024  ID:  Todd Yoder, DOB 07-03-1986, MRN [id number] PCP: Patient, No Pcp Per  [facility] Providers Cardiologist:  Gordy Bergamo, MD   History of Present Illness: Todd Yoder is a 38 y.o. African-American male patient with bipolar disorder, anxiety and depression, history of epilepsy and fibromyalgia reportedly for evaluation of palpitations and syncope.  Presently on long-term lithium therapy.   Discussed the use of AI scribe software for clinical note transcription with the patient, who gave verbal consent to proceed.  History of Present Illness Todd Yoder is a 38 year old male with epilepsy who presents with episodes of heart palpitations. He is accompanied by his mother. He experiences heart palpitations described as 'beat boxing' with visible pulsations through his clothing, occurring every four to five days and lasting one to two hours. The rhythm is inconsistent, with heart rates between 104 and 264 beats per minute. He first noticed these symptoms 3 years ago, coinciding with episodes of syncope but since has been diagnosed with seizure disorder.  Dizziness and occasional pain accompany the palpitations. Episodes occur during both physical activity and rest, with even light activities like mowing the lawn causing fatigue. He has reduced physical activity since a seizure in 2023 and is now disabled, primarily staying at home. He engages in household chores, which can trigger symptoms. He smokes marijuana once a day and does not consume cigarettes or coffee. His mother confirms that symptoms occur regardless of activity level.  Labs    Lab Results  Component Value Date   NA 137 02/16/2023   K 4.0 02/16/2023   CO2 26 02/16/2023   GLUCOSE 101 (H) 02/16/2023   BUN 34 (H) 02/16/2023   CREATININE 1.30 02/16/2023   CALCIUM  8.8 02/16/2023   GFR 71.52 02/16/2023   GFRNONAA >60 05/16/2022      Latest Ref Rng & Units  02/16/2023   10:06 AM 09/06/2022    4:36 PM 05/16/2022   11:46 AM  BMP  Glucose 70 - 99 mg/dL 898  76  91   BUN 6 - 23 mg/dL 34  23  26   Creatinine 0.40 - 1.50 mg/dL 8.69  8.87  8.66   BUN/Creat Ratio 6 - 22 (calc)  SEE NOTE:    Sodium 135 - 145 mEq/L 137  138  139   Potassium 3.5 - 5.1 mEq/L 4.0  4.4  4.4   Chloride 96 - 112 mEq/L 104  104  106   CO2 19 - 32 mEq/L 26  29  21    Calcium  8.4 - 10.5 mg/dL 8.8  9.0  9.3       Latest Ref Rng & Units 02/16/2023   10:06 AM 09/06/2022    4:36 PM 05/16/2022   11:46 AM  CBC  WBC 4.0 - 10.5 K/uL 5.5  5.6  6.9   Hemoglobin 13.0 - 17.0 g/dL 86.1  85.8  85.0   Hematocrit 39.0 - 52.0 % 42.6  42.6  44.2   Platelets 150.0 - 400.0 K/uL 122.0  109  120     External Labs:  PCP labs faxed 05/25/2023:  TSH normal at 1.20.  BNP 35.  Serum troponin negative.  Hb 15.5/HCT 48.1, platelets 100 80K.  Total cholesterol 102, triglycerides 44, HDL 60, LDL 29.  BUN 20, creatinine 1.22, EGFR 79 mL, potassium 4.9, LFTs normal.  ROS  Review of Systems  Cardiovascular:  Positive for palpitations.  Negative for chest pain, dyspnea on exertion and leg swelling.    Physical Exam:   VS:  BP 121/78 (BP Location: Left Arm, Patient Position: Sitting, Cuff Size: Large)   Pulse 67   Resp 16   Ht 6' 6 (1.981 m)   Wt 262 lb 9.6 oz (119.1 kg)   SpO2 99%   BMI 30.35 kg/m    Wt Readings from Last 3 Encounters:  01/09/24 262 lb 9.6 oz (119.1 kg)  01/08/24 264 lb 3.2 oz (119.8 kg)  10/04/23 269 lb (122 kg)    Physical Exam Constitutional:      Appearance: He is obese.  Neck:     Vascular: No carotid bruit or JVD.  Cardiovascular:     Rate and Rhythm: Normal rate and regular rhythm.     Pulses: Intact distal pulses.     Heart sounds: Normal heart sounds. No murmur heard.    No gallop.  Pulmonary:     Effort: Pulmonary effort is normal.     Breath sounds: Normal breath sounds.  Abdominal:     General: Bowel sounds are normal.     Palpations: Abdomen is  soft.  Musculoskeletal:     Right lower leg: No edema.     Left lower leg: No edema.    Studies Reviewed: SABRA     EKG:    EKG Interpretation Date/Time:  Tuesday January 09 2024 11:08:52 EDT Ventricular Rate:  71 PR Interval:  142 QRS Duration:  90 QT Interval:  356 QTC Calculation: 386 R Axis:   -1  Text Interpretation: EKG 01/09/2024: Normal sinus rhythm at rate of 71 bpm, normal EKG. Confirmed by Lilymae Swiech, Jagadeesh ([id number]) on 01/09/2024 11:40:02 AM    Medications ordered    No orders of the defined types were placed in this encounter.    ASSESSMENT AND PLAN: .      ICD-10-CM   1. Rapid palpitations  R00.2 EKG 12-Lead    LONG TERM MONITOR (3-14 DAYS)    2. Dizziness  R42 LONG TERM MONITOR (3-14 DAYS)     Assessment & Plan Palpitations Intermittent episodes of heart racing, occurring approximately once every four to five days, lasting for about one to two hours. Symptoms include dizziness and occasional pain and also dizziness. Episodes occur both during activity and at rest. Previous episodes recorded with heart rates fluctuating between 104 and 264 bpm. EKG performed today is normal. Differential diagnosis includes arrhythmia or benign palpitations. - Order Zio patch for continuous heart monitoring for two weeks to identify any dangerous arrhythmias or confirm benign nature of palpitations. - Encourage normal activities to provoke symptoms while wearing the monitor. - Arrange follow-up with nurse practitioner or physician assistant in four weeks.  Epilepsy Epilepsy with a seizure occurring in 2023. No specific discussion of current management or recent episodes in this encounter. Although patient stated he had syncope 3 years ago, he has since been diagnosed with epilepsy, fortunately no recent episodes.  Presently on Depakote  and also on lithium  If Zio patch monitoring is benign, we will see him back on a as needed basis.  Patient and his mother felt reassured to know that the  palpitations are not life-threatening but life altering indeed if no significant arrhythmias are noted on extended EKG monitoring.  Good living habits, healthy lifestyle, avoidance of excess calories discussed.     Signed,  Gordy Bergamo, MD, [facility] 01/09/2024, 9:37 PM [facility] [address] [place], [place] [address] Phone: [phone]. Fax:  [phone]  

## 2024-01-22 ENCOUNTER — Encounter: Payer: Self-pay | Admitting: Neurology

## 2024-02-04 NOTE — Progress Notes (Deleted)
  Cardiology Office Note   Date:  02/04/2024  ID:  Todd Yoder, Todd Yoder 07-04-1986, MRN [id number] PCP: Patient, No Pcp Per  [facility] Providers Cardiologist:  Gordy Bergamo, MD {  History of Present Illness [name] is a 38 y.o. male with a past medical history of bipolar disorder, anxiety depression, history of epilepsy and fibromyalgia who was seen recently for palpitations and syncope.  He is on long-term lithium therapy.  Recently seen by Dr. Ganji 01/09/2024 for evaluation of heart palpitations.  He experienced palpitations described as beat boxing with visible pulsations through his close occurring every 4 to 5 days and lasting 1 to 2 hours.  The rhythm is inconsistent and heart beats ranged between 104 and 260 4 bpm.  He first noticed the symptoms 3 years ago coinciding with episodes of syncope but since he has been diagnosed with seizure disorder.  Dizziness and occasional pain accompanied the palpitations.  Episodes occurred during both physical activity and rest even with light activities like mowing the lawn which causes fatigue.  He has reduced physical activity since a seizure in 2023 and is now disabled primarily staying at home.  He engages in household chores which can trigger symptoms.  He smokes marijuana once a day and does not consume cigarettes or coffee.  His mother confirms that symptoms occur regardless of activity level.  Today, he***  ROS: ***  Studies Reviewed      *** Risk Assessment/Calculations {Does this patient have ATRIAL FIBRILLATION?[phone]} No BP recorded.  {Refresh Note OR Click here to enter BP  :1}***       Physical Exam VS:  There were no vitals taken for this visit.       Wt Readings from Last 3 Encounters:  01/09/24 262 lb 9.6 oz (119.1 kg)  01/08/24 264 lb 3.2 oz (119.8 kg)  10/04/23 269 lb (122 kg)    GEN: Well nourished, well developed in no acute distress NECK: No JVD; No carotid bruits CARDIAC: ***RRR, no  murmurs, rubs, gallops RESPIRATORY:  Clear to auscultation without rales, wheezing or rhonchi  ABDOMEN: Soft, non-tender, non-distended EXTREMITIES:  No edema; No deformity   ASSESSMENT AND PLAN Rapid palpitations Dizziness    {Are you ordering a CV Procedure (e.g. stress test, cath, DCCV, TEE, etc)?   Press F2        :[id number]}  Dispo: ***  Signed, Orren LOISE Fabry, PA-C

## 2024-02-06 ENCOUNTER — Ambulatory Visit: Admitting: Physician Assistant

## 2024-02-06 DIAGNOSIS — R002 Palpitations: Secondary | ICD-10-CM

## 2024-02-06 DIAGNOSIS — R42 Dizziness and giddiness: Secondary | ICD-10-CM

## 2024-04-18 ENCOUNTER — Ambulatory Visit: Admitting: Neurology

## 2024-04-18 ENCOUNTER — Encounter: Payer: Self-pay | Admitting: Neurology

## 2024-04-18 VITALS — BP 138/75 | HR 65 | Ht 78.0 in | Wt 268.0 lb

## 2024-04-18 DIAGNOSIS — R52 Pain, unspecified: Secondary | ICD-10-CM

## 2024-04-18 DIAGNOSIS — R296 Repeated falls: Secondary | ICD-10-CM

## 2024-04-18 DIAGNOSIS — G40009 Localization-related (focal) (partial) idiopathic epilepsy and epileptic syndromes with seizures of localized onset, not intractable, without status epilepticus: Secondary | ICD-10-CM

## 2024-04-18 MED ORDER — GABAPENTIN 300 MG PO CAPS: ORAL_CAPSULE | ORAL | 3 refills | Status: AC

## 2024-04-18 MED ORDER — DIVALPROEX SODIUM ER 500 MG PO TB24: ORAL_TABLET | ORAL | 3 refills | Status: AC

## 2024-04-18 NOTE — Patient Instructions (Addendum)
 It's always good to see you.  Continue all your medications. Let me know if any problems with the pharmacy  2. Referral will be sent for Balance Therapy  3. Proceed with looking into Pain Management and hopefully Rheumatology  4. Here is the information for the [employer] [facility] [place] to give guidance on avenues for work for patients with seizures Local: [phone] Email: efnc@[facility] .edu Helpline: (800) [phone]  5. Follow-up in 3-4 months, call for any changes   Seizure Precautions: 1. If medication has been prescribed for you to prevent seizures, take it exactly as directed.  Do not stop taking the medicine without talking to your doctor first, even if you have not had a seizure in a long time.   2. Avoid activities in which a seizure would cause danger to yourself or to others.  Don't operate dangerous machinery, swim alone, or climb in high or dangerous places, such as on ladders, roofs, or girders.  Do not drive unless your doctor says you may.  3. If you have any warning that you may have a seizure, lay down in a safe place where you can't hurt yourself.    4.  No driving for 6 months from last seizure, as per [place]  state law.   Please refer to the following link on the [employer]'s website for more information: http://www.epilepsyfoundation.org/answerplace/Social/driving/drivingu.cfm   5.  Maintain good sleep hygiene.  6.  Contact your doctor if you have any problems that may be related to the medicine you are taking.  7.  Call 911 and bring the patient back to the ED if:        A.  The seizure lasts longer than 5 minutes.       B.  The patient doesn't awaken shortly after the seizure  C.  The patient has new problems such as difficulty seeing, speaking or moving  D.  The patient was injured during the seizure  E.  The patient has a temperature over 102 F (39C)  F.  The patient vomited and now is having trouble breathing

## 2024-04-18 NOTE — Progress Notes (Signed)
 NEUROLOGY FOLLOW UP OFFICE NOTE  Todd Yoder [id number] 1986/04/15  HISTORY OF PRESENT ILLNESS: I had the pleasure of seeing Todd Yoder in follow-up in the neurology clinic on 04/18/2024.  The patient was last seen 3 months ago for several symptoms. He is again accompanied by his mother who helps supplement the history today.  Records and images were personally reviewed where available. Since his last visit, he reports a nocturnal seizure at the end of August. He had self-discontinued the Depakote  for a week or so mid-August, then was spotty afterwards. Since the seizure, he is back to taking Depakote  1000mg  in AM, 1500mg  in PM regularly. He continues to have diffuse body pain, sometimes his feet are on fire. He reports pharmacy did not fill the updated Gabapentin  prescription, last refill in August was still 300mg  in AM, 600mg  in PM. He has seen Ortho and is awaiting Rheumatology evaluation. He is looking into Pain Management as well. He is looking for something to help him through the day a little better. He continues to have balance problems, he reports orthostatic vital signs were checked at Cardiology office and BP was noted to change with position. He was reporting tachycardia and was mailed a Zio monitor but it did not stick so he decided not to follow through. Last fall was 2 days ago when he stood up quickly because the water was boiling. No loss of consciousness, he felt dizzy but ignored it. He feels more stable/settled on Lithium 1-1-2. He notes he starts shaking before his next dose of medications.    Since his last visit, they deny any seizures since January 2025. He continues on Depakote  1000mg  in AM, 1500mg  in PM. He has a lot of pain and takes Gabpaentin 300mg  in AM, 600mg  in PM without side effects. He continues to see Ortho, he states he was hurting for 2 days after the MRI. They report he is supposed to start PT for his shoulder, but Ortho also wants him to see a  rheumatologist before seeing Pain Management. They reports quite a bit of stress ongoing currently, his grandfather is pretty sick, which stresses him out. The Lithium does help make days brighter. He continues to have falls, he had 2 falls last week, no lost of consciousness. He states that falls happen out of nowhere, he loses his balance. His mother feels he rushes too much, he states maybe I need to slow down.    History on Initial Assessment 12/18/2018: This is a very pleasant 38 year old right-handed man with a history of bipolar disorder presenting for evaluation of new onset seizures. The first seizure occurred in his sleep on 10/04/2018. He had been sleep deprived for 1-2 days with only 4 hours of sleep, he recalled feeling tired and had a little headache then woke up inside the ambulance. His had bit his lip/tongue. He was brought to [facility] where CBC, BMP, head CT without contrast were unremarkable except for slight cerebellar tonsillar ectopia. UDS positive for THC. He had another seizure on 11/25/2018 again in his sleep. He again had sleep deprivation since they were short staffed at work, he works as a Lawyer. He got off work at [employer] and started moving his things into storage. He had a little nip of scotch and brandy, lay down, then woke up to family pushing him to go to the hospital. If felt like his muscles had a rigorous workout. He was brought to [facility] and as he was getting an MRI,  he recalls hyperventilating when his head was put in the helmet vise, then he woke up 5 hours later with shoulder pain R>L. He was reportedly foaming at the mouth with tongue bite, with a convulsive seizure lasting a couple of minutes. He had an MRI brain without contrast which I personally reviewed, no acute changes, hippocampi symmetric with no abnormal signal. 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 His wake and sleep  EEG was normal. He was discharged home on Keppra  but stopped it after a week because it was making him hostile. His mother takes Depakote  so he had been taking his mother's Depakote  500mg  BID since then and denies any further convulsions.  He reports recurrent symptoms since 2018 when he started having panic attacks out of nowhere. He would start sweating and fell weak, like he would fall. Standing in front of a fan seemed to help, it would last 5-6 minutes then recur for several times. He has also been having a sensation running up and down his chest and back, an uncontrollable cool sensation that has quieted down some since starting medication. He recalls an episode at age 89 when he was driving to [facility] and it felt like his whole body paused, then he snapped back with the car still in motion. He attributed this to smoking marijuana. He denies any staring/unresponsive episodes, gaps in time, olfactory/gustatory hallucinations, focal numbness/tingling/weakness, myoclonic jerks. Since Sunday, he would start having a pounding headache right before he takes his dose of Depakote . He denies any dizziness, diplopia, neck/back pain. No side effects on Depakote .   Epilepsy Risk Factors:  His maternal grandfather, mother, maternal aunt, and maternal cousin have seizures. Otherwise he had a normal birth and early development.  There is no history of febrile convulsions, CNS infections such as meningitis/encephalitis, significant traumatic brain injury, neurosurgical procedures.  Diagnostic Data: MRI brain without contrast in 11/2018 showed no acute changes, hippocampi symmetric with no abnormal signal. 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   MRI brain with and without contrast done 12/2023 normal.  His wake and sleep EEG in 11/2018 was normal.  48-hour EEG in 02/2020 showed frequent right anterior  temporal epileptiform discharges exclusively in sleep. There were 3 electrographic seizures captured arising from the right temporal region lasting 70 seconds to 2.5 minutes with no clinical symptoms reported.  1-hour EEG in 11/2023 showed frequent right frontotemporal epileptiform discharges in sleep.   He was reporting swallowing difficulties and had a swallow evaluation in 11/2023 with overall functional oropharyngeal swallow. There was impairment in swallow efficacy with slowed and reduced base of tongue retraction, outpatient speech therapy was recommended.   Prior ASMs: Keppra  (hostile)  PAST MEDICAL HISTORY: Past Medical History:  Diagnosis Date   Bipolar disorder (HCC)    Family history of adverse reaction to anesthesia    problem with aunt waking up after surgery   GERD (gastroesophageal reflux disease)    Seizures (HCC)     MEDICATIONS: Current Outpatient Medications on File Prior to Visit  Medication Sig Dispense Refill   cyclobenzaprine  (FLEXERIL ) 5 MG tablet Take 1 tablet (5 mg total) by mouth every 8 (eight) hours as needed for muscle spasms. 30 tablet 11   divalproex  (DEPAKOTE  ER) 500 MG 24 hr tablet Take 2 tabs in AM, 3 tabs in PM  450 tablet 3   gabapentin  (NEURONTIN ) 300 MG capsule Take 2 capsules in AM, 2 capsules in PM 360 capsule 3   lithium carbonate 300 MG capsule Take 300 mg by mouth 3 (three) times daily.     Pseudoeph-Doxylamine-DM-APAP (NYQUIL PO) Take 1 Dose by mouth daily as needed (Itchy Throat).     No current facility-administered medications on file prior to visit.    ALLERGIES: Allergies  Allergen Reactions   [place] Nut (Berthollefia Rosanne)    Cipro  [Ciprofloxacin  Hcl]     Burn with urination    Tramadol  Nausea And Vomiting   Augmentin [Amoxicillin-Pot Clavulanate] Rash and Other (See Comments)    Has patient had a PCN reaction causing immediate rash, facial/tongue/throat swelling, SOB or lightheadedness with hypotension: No Has patient had a  PCN reaction causing severe rash involving mucus membranes or skin necrosis: Yes Has patient had a PCN reaction that required hospitalization Yes Has patient had a PCN reaction occurring within the last 10 years: No If all of the above answers are NO, then may proceed with Cephalosporin use.    Nickel Rash    Burning    Silver Rash    Burning     FAMILY HISTORY: Family History  Problem Relation Age of Onset   Diabetes Mother    Hypertension Mother    Sarcoidosis Mother    Atrial fibrillation Mother    Hypertension Father    Diabetes Maternal Grandfather    Atrial fibrillation Maternal Grandfather    Hypertension Paternal Grandfather    Atrial fibrillation Paternal Grandfather    Parkinson's disease Paternal Grandfather     SOCIAL HISTORY: Social History   Socioeconomic History   Marital status: Single    Spouse name: Not on file   Number of children: Not on file   Years of education: Not on file   Highest education level: Not on file  Occupational History   Occupation: CNA  Tobacco Use   Smoking status: Former    Current packs/day: 0.00    Types: Cigarettes    Quit date: 01/24/2021    Years since quitting: 3.2   Smokeless tobacco: Never   Tobacco comments:    smokes cigarettes occasionally/ cigars  Vaping Use   Vaping status: Former  Substance and Sexual Activity   Alcohol use: Not Currently    Comment: occasional   Drug use: Yes    Types: Marijuana    Comment: 3 a week   Sexual activity: Not Currently  Other Topics Concern   Not on file  Social History Narrative   Right handed    One level home   Highest level of edu- some college   Caffeine none   Not working    Lives with family   Social Drivers of Corporate investment banker Strain: Not on file  Food Insecurity: Not on file  Transportation Needs: Not on file  Physical Activity: Not on file  Stress: Not on file  Social Connections: Not on file  Intimate Partner Violence: Not on file      PHYSICAL EXAM: Vitals:   04/18/24 1108  BP: 138/75  Pulse: 65  SpO2: 96%   General: No acute distress Head:  Normocephalic/atraumatic Skin/Extremities: No rash, no edema Neurological Exam: alert and awake. No aphasia or dysarthria. Fund of knowledge is appropriate.  Attention and concentration are normal.   Cranial nerves: Pupils equal, round. Extraocular movements intact.  No facial asymmetry.  Motor: moves all extremities symmetrically at least anti-gravity  x 4.  Gait slow and cautious, no ataxia. No tremors.   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 He had been seizure-free for 8 months until 02/2024 due to medication noncompliance. He is back to taking Depakote  1000mg  in AM, 1500mg  in PM. Proceed with increasing Gabapentin  to 600mg  BID to help with pain, agree with Pain Management evaluation. He continues to report frequent falls, MRI brain normal. Balance therapy will be ordered. He is frustrated with inability to work, information for the [facility] [employer] [facility] [place] was provided to help with resources for avenues for work. He is aware of [place] driving laws to stop driving after a seizure until 6 months seizure-free. Follow-up in 3-4 months, call for any changes.   Thank you for allowing me to participate in his care.  Please do not hesitate to call for any questions or concerns.   Darice Shivers, M.D.

## 2024-04-26 ENCOUNTER — Ambulatory Visit: Admitting: Physical Therapy

## 2024-04-26 NOTE — Therapy (Incomplete)
 OUTPATIENT PHYSICAL THERAPY NEURO EVALUATION   Patient Name: Todd Yoder MRN: [id number] DOB:[birth year], 38 y.o., male Today's Date: 04/26/2024   PCP: *** REFERRING PROVIDER: Georjean Darice CHRISTELLA, MD  END OF SESSION:   Past Medical History:  Diagnosis Date   Bipolar disorder [facility])    Family history of adverse reaction to anesthesia    problem with aunt waking up after surgery   GERD (gastroesophageal reflux disease)    Seizures (HCC)    Past Surgical History:  Procedure Laterality Date   KNEE ARTHROSCOPY WITH MENISCAL REPAIR Right 09/19/2017   Procedure: KNEE ARTHROSCOPY WITH MENISCAL REPAIR;  Surgeon: Beverley Evalene BIRCH, MD;  Location: [facility];  Service: Orthopedics;  Laterality: Right;   TOOTH EXTRACTION     Patient Active Problem List   Diagnosis Date Noted   Seizures (HCC) 11/25/2018    ONSET DATE: 04/18/2024  REFERRING DIAG: R29.6 (ICD-10-CM) - Frequent falls  THERAPY DIAG:  No diagnosis found.  Rationale for Evaluation and Treatment: Rehabilitation  SUBJECTIVE:                                                                                                                                                                                             SUBJECTIVE STATEMENT: ***  Pt accompanied by: {accompnied:27141}  PERTINENT HISTORY: ***  Per Dr. Georjean note *** I had the pleasure of seeing Todd Yoder in follow-up in the neurology clinic on 04/18/2024.  The patient was last seen 3 months ago for several symptoms. He is again accompanied by his mother who helps supplement the history today.  Records and images were personally reviewed where available. Since his last visit, he reports a nocturnal seizure at the end of August. He had self-discontinued the Depakote  for a week or so mid-August, then was spotty afterwards. Since the seizure, he is back to taking Depakote  1000mg  in AM, 1500mg  in PM regularly. He continues to have diffuse body pain,  sometimes his feet are on fire. He reports pharmacy did not fill the updated Gabapentin  prescription, last refill in August was still 300mg  in AM, 600mg  in PM. He has seen Ortho and is awaiting Rheumatology evaluation. He is looking into Pain Management as well. He is looking for something to help him through the day a little better. He continues to have balance problems, he reports orthostatic vital signs were checked at Cardiology office and BP was noted to change with position. He was reporting tachycardia and was mailed a Zio monitor but it did not stick so he decided not to follow through. Last fall was 2 days ago when he stood up  quickly because the water was boiling. No loss of consciousness, he felt dizzy but ignored it. He feels more stable/settled on Lithium 1-1-2. He notes he starts shaking before his next dose of medications.      Since his last visit, they deny any seizures since January 2025. He continues on Depakote  1000mg  in AM, 1500mg  in PM. He has a lot of pain and takes Gabpaentin 300mg  in AM, 600mg  in PM without side effects. He continues to see Ortho, he states he was hurting for 2 days after the MRI. They report he is supposed to start PT for his shoulder, but Ortho also wants him to see a rheumatologist before seeing Pain Management. They reports quite a bit of stress ongoing currently, his grandfather is pretty sick, which stresses him out. The Lithium does help make days brighter. He continues to have falls, he had 2 falls last week, no lost of consciousness. He states that falls happen out of nowhere, he loses his balance. His mother feels he rushes too much, he states maybe I need to slow down.   PAIN:  Are you having pain? {OPRCPAIN:27236}  PRECAUTIONS: {Therapy precautions:24002}  RED FLAGS: {PT Red Flags:29287}   WEIGHT BEARING RESTRICTIONS: {Yes ***/No:24003}  FALLS: Has patient fallen in last 6 months? {fallsyesno:27318}  LIVING ENVIRONMENT: Lives with: {OPRC lives  with:25569::lives with their family} Lives in: {Lives in:[address]70} Stairs: {opstairs:27293} Has following equipment at home: {Assistive devices:23999}  PLOF: {PLOF:24004}  PATIENT GOALS: ***  OBJECTIVE:  Note: Objective measures were completed at Evaluation unless otherwise noted.  DIAGNOSTIC FINDINGS: ***  COGNITION: Overall cognitive status: {cognition:24006}   SENSATION: {sensation:27233}  COORDINATION: ***  EDEMA:  {edema:24020}  MUSCLE TONE: {LE tone:25568}  MUSCLE LENGTH: Hamstrings: Right *** deg; Left *** deg Debby test: Right *** deg; Left *** deg  DTRs:  {DTR SITE:24025}  POSTURE: {posture:25561}  LOWER EXTREMITY ROM:     {AROM/PROM:27142}  Right Eval Left Eval  Hip flexion    Hip extension    Hip abduction    Hip adduction    Hip internal rotation    Hip external rotation    Knee flexion    Knee extension    Ankle dorsiflexion    Ankle plantarflexion    Ankle inversion    Ankle eversion     (Blank rows = not tested)  LOWER EXTREMITY MMT:    MMT Right Eval Left Eval  Hip flexion    Hip extension    Hip abduction    Hip adduction    Hip internal rotation    Hip external rotation    Knee flexion    Knee extension    Ankle dorsiflexion    Ankle plantarflexion    Ankle inversion    Ankle eversion    (Blank rows = not tested)  BED MOBILITY:  {bed mobility:32615:p}  TRANSFERS: {transfers eval:32620}  RAMP:  {ramp eval:32616}  CURB:  {curb eval:32617}  STAIRS: {stairs eval:32618} GAIT: Findings: {GaitneuroPT:32644::Distance walked: ***,Comments: ***}  FUNCTIONAL TESTS:  {Functional tests:24029}  PATIENT SURVEYS:  {rehab surveys:24030}  TREATMENT DATE: *** PT Evaluation   PATIENT EDUCATION: Education details: *** Person educated: {Person educated:25204} Education method: {Education  Method:25205} Education comprehension: {Education Comprehension:25206}  HOME EXERCISE PROGRAM: ***  GOALS: Goals reviewed with patient? {yes/no:20286}  SHORT TERM GOALS: Target date: ***  *** Baseline: Goal status: INITIAL  2.  *** Baseline:  Goal status: INITIAL  3.  *** Baseline:  Goal status: INITIAL  4.  *** Baseline:  Goal status: INITIAL  5.  *** Baseline:  Goal status: INITIAL  6.  *** Baseline:  Goal status: INITIAL  LONG TERM GOALS: Target date: ***  *** Baseline:  Goal status: INITIAL  2.  *** Baseline:  Goal status: INITIAL  3.  *** Baseline:  Goal status: INITIAL  4.  *** Baseline:  Goal status: INITIAL  5.  *** Baseline:  Goal status: INITIAL  6.  *** Baseline:  Goal status: INITIAL  ASSESSMENT:  CLINICAL IMPRESSION: Patient is a *** year old *** referred to Neuro OPPT for***.   Pt's PMH is significant for: *** The following deficits were present during the exam: ***. Based on ***, pt is an incr risk for falls. Pt would benefit from skilled PT to address these impairments and functional limitations to maximize functional mobility independence   OBJECTIVE IMPAIRMENTS: {opptimpairments:25111}.   ACTIVITY LIMITATIONS: {activitylimitations:27494}  PARTICIPATION LIMITATIONS: {participationrestrictions:25113}  PERSONAL FACTORS: {Personal factors:25162} are also affecting patient's functional outcome.   REHAB POTENTIAL: {rehabpotential:25112}  CLINICAL DECISION MAKING: {clinical decision making:25114}  EVALUATION COMPLEXITY: {Evaluation complexity:25115}  PLAN:  PT FREQUENCY: {rehab frequency:25116}  PT DURATION: {rehab duration:25117}  PLANNED INTERVENTIONS: {rehab planned interventions:25118::97110-Therapeutic exercises,97530- Therapeutic [phone]- Neuromuscular re-education,97535- Self Rjmz,02859- Manual therapy,Patient/Family education}  PLAN FOR NEXT SESSION: ***   Waddell Southgate, PT Waddell Southgate, PT, DPT, CSRS  04/26/2024, 8:57 AM

## 2024-05-01 ENCOUNTER — Ambulatory Visit: Admitting: Physical Therapy

## 2024-05-09 ENCOUNTER — Ambulatory Visit: Admitting: Physical Therapy

## 2024-05-09 NOTE — Therapy (Incomplete)
 OUTPATIENT PHYSICAL THERAPY NEURO EVALUATION   Patient Name: Todd Yoder MRN: [id number] DOB:1985/12/20, 38 y.o., male Today's Date: 05/09/2024   PCP: *** REFERRING PROVIDER: Georjean Darice CHRISTELLA, MD  END OF SESSION:   Past Medical History:  Diagnosis Date   Bipolar disorder [facility])    Family history of adverse reaction to anesthesia    problem with aunt waking up after surgery   GERD (gastroesophageal reflux disease)    Seizures (HCC)    Past Surgical History:  Procedure Laterality Date   KNEE ARTHROSCOPY WITH MENISCAL REPAIR Right 09/19/2017   Procedure: KNEE ARTHROSCOPY WITH MENISCAL REPAIR;  Surgeon: Beverley Evalene BIRCH, MD;  Location: [facility];  Service: Orthopedics;  Laterality: Right;   TOOTH EXTRACTION     Patient Active Problem List   Diagnosis Date Noted   Seizures (HCC) 11/25/2018    ONSET DATE: 04/18/2024  REFERRING DIAG: R29.6 (ICD-10-CM) - Frequent falls  THERAPY DIAG:  No diagnosis found.  Rationale for Evaluation and Treatment: Rehabilitation  SUBJECTIVE:                                                                                                                                                                                             SUBJECTIVE STATEMENT: ***  Pt accompanied by: {accompnied:27141}  PERTINENT HISTORY: ***  Per Dr. Georjean note *** I had the pleasure of seeing Todd Yoder in follow-up in the neurology clinic on 04/18/2024.  The patient was last seen 3 months ago for several symptoms. He is again accompanied by his mother who helps supplement the history today.  Records and images were personally reviewed where available. Since his last visit, he reports a nocturnal seizure at the end of August. He had self-discontinued the Depakote  for a week or so mid-August, then was spotty afterwards. Since the seizure, he is back to taking Depakote  1000mg  in AM, 1500mg  in PM regularly. He continues to have diffuse body pain,  sometimes his feet are on fire. He reports pharmacy did not fill the updated Gabapentin  prescription, last refill in August was still 300mg  in AM, 600mg  in PM. He has seen Ortho and is awaiting Rheumatology evaluation. He is looking into Pain Management as well. He is looking for something to help him through the day a little better. He continues to have balance problems, he reports orthostatic vital signs were checked at Cardiology office and BP was noted to change with position. He was reporting tachycardia and was mailed a Zio monitor but it did not stick so he decided not to follow through. Last fall was 2 days ago when he stood up  quickly because the water was boiling. No loss of consciousness, he felt dizzy but ignored it. He feels more stable/settled on Lithium 1-1-2. He notes he starts shaking before his next dose of medications.      Since his last visit, they deny any seizures since January 2025. He continues on Depakote  1000mg  in AM, 1500mg  in PM. He has a lot of pain and takes Gabpaentin 300mg  in AM, 600mg  in PM without side effects. He continues to see Ortho, he states he was hurting for 2 days after the MRI. They report he is supposed to start PT for his shoulder, but Ortho also wants him to see a rheumatologist before seeing Pain Management. They reports quite a bit of stress ongoing currently, his grandfather is pretty sick, which stresses him out. The Lithium does help make days brighter. He continues to have falls, he had 2 falls last week, no lost of consciousness. He states that falls happen out of nowhere, he loses his balance. His mother feels he rushes too much, he states maybe I need to slow down.   PAIN:  Are you having pain? {OPRCPAIN:27236}  PRECAUTIONS: {Therapy precautions:24002}  RED FLAGS: {PT Red Flags:29287}   WEIGHT BEARING RESTRICTIONS: {Yes ***/No:24003}  FALLS: Has patient fallen in last 6 months? {fallsyesno:27318}  LIVING ENVIRONMENT: Lives with: {OPRC lives  with:25569::lives with their family} Lives in: {Lives in:[address]70} Stairs: {opstairs:27293} Has following equipment at home: {Assistive devices:23999}  PLOF: {PLOF:24004}  PATIENT GOALS: ***  OBJECTIVE:  Note: Objective measures were completed at Evaluation unless otherwise noted.  DIAGNOSTIC FINDINGS: ***  COGNITION: Overall cognitive status: {cognition:24006}   SENSATION: {sensation:27233}  COORDINATION: ***  EDEMA:  {edema:24020}  MUSCLE TONE: {LE tone:25568}  MUSCLE LENGTH: Hamstrings: Right *** deg; Left *** deg Debby test: Right *** deg; Left *** deg  DTRs:  {DTR SITE:24025}  POSTURE: {posture:25561}  LOWER EXTREMITY ROM:     {AROM/PROM:27142}  Right Eval Left Eval  Hip flexion    Hip extension    Hip abduction    Hip adduction    Hip internal rotation    Hip external rotation    Knee flexion    Knee extension    Ankle dorsiflexion    Ankle plantarflexion    Ankle inversion    Ankle eversion     (Blank rows = not tested)  LOWER EXTREMITY MMT:    MMT Right Eval Left Eval  Hip flexion    Hip extension    Hip abduction    Hip adduction    Hip internal rotation    Hip external rotation    Knee flexion    Knee extension    Ankle dorsiflexion    Ankle plantarflexion    Ankle inversion    Ankle eversion    (Blank rows = not tested)  BED MOBILITY:  {bed mobility:32615:p}  TRANSFERS: {transfers eval:32620}  RAMP:  {ramp eval:32616}  CURB:  {curb eval:32617}  STAIRS: {stairs eval:32618} GAIT: Findings: {GaitneuroPT:32644::Distance walked: ***,Comments: ***}  FUNCTIONAL TESTS:  {Functional tests:24029}  PATIENT SURVEYS:  {rehab surveys:24030}  TREATMENT DATE: *** PT Evaluation   PATIENT EDUCATION: Education details: *** Person educated: {Person educated:25204} Education method: {Education  Method:25205} Education comprehension: {Education Comprehension:25206}  HOME EXERCISE PROGRAM: ***  GOALS: Goals reviewed with patient? {yes/no:20286}  SHORT TERM GOALS: Target date: ***  *** Baseline: Goal status: INITIAL  2.  *** Baseline:  Goal status: INITIAL  3.  *** Baseline:  Goal status: INITIAL  4.  *** Baseline:  Goal status: INITIAL  5.  *** Baseline:  Goal status: INITIAL  6.  *** Baseline:  Goal status: INITIAL  LONG TERM GOALS: Target date: ***  *** Baseline:  Goal status: INITIAL  2.  *** Baseline:  Goal status: INITIAL  3.  *** Baseline:  Goal status: INITIAL  4.  *** Baseline:  Goal status: INITIAL  5.  *** Baseline:  Goal status: INITIAL  6.  *** Baseline:  Goal status: INITIAL  ASSESSMENT:  CLINICAL IMPRESSION: Patient is a *** year old *** referred to Neuro OPPT for***.   Pt's PMH is significant for: *** The following deficits were present during the exam: ***. Based on ***, pt is an incr risk for falls. Pt would benefit from skilled PT to address these impairments and functional limitations to maximize functional mobility independence   OBJECTIVE IMPAIRMENTS: {opptimpairments:25111}.   ACTIVITY LIMITATIONS: {activitylimitations:27494}  PARTICIPATION LIMITATIONS: {participationrestrictions:25113}  PERSONAL FACTORS: {Personal factors:25162} are also affecting patient's functional outcome.   REHAB POTENTIAL: {rehabpotential:25112}  CLINICAL DECISION MAKING: {clinical decision making:25114}  EVALUATION COMPLEXITY: {Evaluation complexity:25115}  PLAN:  PT FREQUENCY: {rehab frequency:25116}  PT DURATION: {rehab duration:25117}  PLANNED INTERVENTIONS: {rehab planned interventions:25118::97110-Therapeutic exercises,97530- Therapeutic [phone]- Neuromuscular re-education,97535- Self Rjmz,02859- Manual therapy,Patient/Family education}  PLAN FOR NEXT SESSION: ***   Waddell Southgate, PT Waddell Southgate, PT, DPT, CSRS  05/09/2024, 9:02 AM    For all possible CPT codes, reference the Planned Interventions line above.     Check all conditions that are expected to impact treatment: {Conditions expected to impact treatment:{Conditions expected to impact treatment:28273}   If treatment provided at initial evaluation, no treatment charged due to lack of authorization.

## 2024-05-21 ENCOUNTER — Encounter: Payer: Self-pay | Admitting: Neurology

## 2024-05-23 ENCOUNTER — Ambulatory Visit: Admitting: Physical Therapy

## 2024-05-23 NOTE — Therapy (Incomplete)
 OUTPATIENT PHYSICAL THERAPY NEURO EVALUATION   Patient Name: Todd Yoder MRN: [id number] DOB:January 31, 1986, 38 y.o., male Today's Date: 05/23/2024   PCP: PIERRETTE REFERRING PROVIDER: Georjean Darice CHRISTELLA, MD  END OF SESSION:   Past Medical History:  Diagnosis Date   Bipolar disorder [facility])    Family history of adverse reaction to anesthesia    problem with aunt waking up after surgery   GERD (gastroesophageal reflux disease)    Seizures (HCC)    Past Surgical History:  Procedure Laterality Date   KNEE ARTHROSCOPY WITH MENISCAL REPAIR Right 09/19/2017   Procedure: KNEE ARTHROSCOPY WITH MENISCAL REPAIR;  Surgeon: Beverley Evalene BIRCH, MD;  Location: [facility];  Service: Orthopedics;  Laterality: Right;   TOOTH EXTRACTION     Patient Active Problem List   Diagnosis Date Noted   Seizures (HCC) 11/25/2018    ONSET DATE: 04/18/2024  REFERRING DIAG: R29.6 (ICD-10-CM) - Frequent falls  THERAPY DIAG:  No diagnosis found.  Rationale for Evaluation and Treatment: Rehabilitation  SUBJECTIVE:                                                                                                                                                                                             SUBJECTIVE STATEMENT: ***  Pt accompanied by: {accompnied:27141}  PERTINENT HISTORY: ***  Per Dr. Georjean note *** I had the pleasure of seeing Todd Yoder in follow-up in the neurology clinic on 04/18/2024.  The patient was last seen 3 months ago for several symptoms. He is again accompanied by his mother who helps supplement the history today.  Records and images were personally reviewed where available. Since his last visit, he reports a nocturnal seizure at the end of August. He had self-discontinued the Depakote  for a week or so mid-August, then was spotty afterwards. Since the seizure, he is back to taking Depakote  1000mg  in AM, 1500mg  in PM regularly. He continues to have diffuse body pain,  sometimes his feet are on fire. He reports pharmacy did not fill the updated Gabapentin  prescription, last refill in August was still 300mg  in AM, 600mg  in PM. He has seen Ortho and is awaiting Rheumatology evaluation. He is looking into Pain Management as well. He is looking for something to help him through the day a little better. He continues to have balance problems, he reports orthostatic vital signs were checked at Cardiology office and BP was noted to change with position. He was reporting tachycardia and was mailed a Zio monitor but it did not stick so he decided not to follow through. Last fall was 2 days ago when he stood up  quickly because the water was boiling. No loss of consciousness, he felt dizzy but ignored it. He feels more stable/settled on Lithium 1-1-2. He notes he starts shaking before his next dose of medications.      Since his last visit, they deny any seizures since January 2025. He continues on Depakote  1000mg  in AM, 1500mg  in PM. He has a lot of pain and takes Gabpaentin 300mg  in AM, 600mg  in PM without side effects. He continues to see Ortho, he states he was hurting for 2 days after the MRI. They report he is supposed to start PT for his shoulder, but Ortho also wants him to see a rheumatologist before seeing Pain Management. They reports quite a bit of stress ongoing currently, his grandfather is pretty sick, which stresses him out. The Lithium does help make days brighter. He continues to have falls, he had 2 falls last week, no lost of consciousness. He states that falls happen out of nowhere, he loses his balance. His mother feels he rushes too much, he states maybe I need to slow down.   PAIN:  Are you having pain? {OPRCPAIN:27236}  PRECAUTIONS: {Therapy precautions:24002}  RED FLAGS: {PT Red Flags:29287}   WEIGHT BEARING RESTRICTIONS: {Yes ***/No:24003}  FALLS: Has patient fallen in last 6 months? {fallsyesno:27318}  LIVING ENVIRONMENT: Lives with: {OPRC lives  with:25569::lives with their family} Lives in: {Lives in:[address]70} Stairs: {opstairs:27293} Has following equipment at home: {Assistive devices:23999}  PLOF: {PLOF:24004}  PATIENT GOALS: ***  OBJECTIVE:  Note: Objective measures were completed at Evaluation unless otherwise noted.  DIAGNOSTIC FINDINGS: ***  COGNITION: Overall cognitive status: {cognition:24006}   SENSATION: {sensation:27233}  COORDINATION: ***  EDEMA:  {edema:24020}  MUSCLE TONE: {LE tone:25568}  MUSCLE LENGTH: Hamstrings: Right *** deg; Left *** deg Debby test: Right *** deg; Left *** deg  DTRs:  {DTR SITE:24025}  POSTURE: {posture:25561}  LOWER EXTREMITY ROM:     {AROM/PROM:27142}  Right Eval Left Eval  Hip flexion    Hip extension    Hip abduction    Hip adduction    Hip internal rotation    Hip external rotation    Knee flexion    Knee extension    Ankle dorsiflexion    Ankle plantarflexion    Ankle inversion    Ankle eversion     (Blank rows = not tested)  LOWER EXTREMITY MMT:    MMT Right Eval Left Eval  Hip flexion    Hip extension    Hip abduction    Hip adduction    Hip internal rotation    Hip external rotation    Knee flexion    Knee extension    Ankle dorsiflexion    Ankle plantarflexion    Ankle inversion    Ankle eversion    (Blank rows = not tested)  BED MOBILITY:  {bed mobility:32615:p}  TRANSFERS: {transfers eval:32620}  RAMP:  {ramp eval:32616}  CURB:  {curb eval:32617}  STAIRS: {stairs eval:32618} GAIT: Findings: {GaitneuroPT:32644::Distance walked: ***,Comments: ***}  FUNCTIONAL TESTS:  {Functional tests:24029}  PATIENT SURVEYS:  {rehab surveys:24030}  TREATMENT DATE: *** PT Evaluation   PATIENT EDUCATION: Education details: *** Person educated: {Person educated:25204} Education method: {Education  Method:25205} Education comprehension: {Education Comprehension:25206}  HOME EXERCISE PROGRAM: ***  GOALS: Goals reviewed with patient? {yes/no:20286}  SHORT TERM GOALS: Target date: ***  *** Baseline: Goal status: INITIAL  2.  *** Baseline:  Goal status: INITIAL  3.  *** Baseline:  Goal status: INITIAL  4.  *** Baseline:  Goal status: INITIAL  5.  *** Baseline:  Goal status: INITIAL  6.  *** Baseline:  Goal status: INITIAL  LONG TERM GOALS: Target date: ***  *** Baseline:  Goal status: INITIAL  2.  *** Baseline:  Goal status: INITIAL  3.  *** Baseline:  Goal status: INITIAL  4.  *** Baseline:  Goal status: INITIAL  5.  *** Baseline:  Goal status: INITIAL  6.  *** Baseline:  Goal status: INITIAL  ASSESSMENT:  CLINICAL IMPRESSION: Patient is a *** year old *** referred to Neuro OPPT for***.   Pt's PMH is significant for: *** The following deficits were present during the exam: ***. Based on ***, pt is an incr risk for falls. Pt would benefit from skilled PT to address these impairments and functional limitations to maximize functional mobility independence   OBJECTIVE IMPAIRMENTS: {opptimpairments:25111}.   ACTIVITY LIMITATIONS: {activitylimitations:27494}  PARTICIPATION LIMITATIONS: {participationrestrictions:25113}  PERSONAL FACTORS: {Personal factors:25162} are also affecting patient's functional outcome.   REHAB POTENTIAL: {rehabpotential:25112}  CLINICAL DECISION MAKING: {clinical decision making:25114}  EVALUATION COMPLEXITY: {Evaluation complexity:25115}  PLAN:  PT FREQUENCY: {rehab frequency:25116}  PT DURATION: {rehab duration:25117}  PLANNED INTERVENTIONS: {rehab planned interventions:25118::97110-Therapeutic exercises,97530- Therapeutic [phone]- Neuromuscular re-education,97535- Self Rjmz,02859- Manual therapy,Patient/Family education}  PLAN FOR NEXT SESSION: ***   Waddell Southgate, PT Waddell Southgate, PT, DPT, CSRS  05/23/2024, 7:58 AM    For all possible CPT codes, reference the Planned Interventions line above.     Check all conditions that are expected to impact treatment: {Conditions expected to impact treatment:{Conditions expected to impact treatment:28273}   If treatment provided at initial evaluation, no treatment charged due to lack of authorization.

## 2024-05-29 ENCOUNTER — Ambulatory Visit: Admitting: Physical Therapy

## 2024-07-23 NOTE — Progress Notes (Unsigned)
 "  NEUROLOGY FOLLOW UP OFFICE NOTE  Todd Yoder [id number] August 29, 1985  HISTORY OF PRESENT ILLNESS: I had the pleasure of seeing Todd Yoder in follow-up in the neurology clinic on 07/24/2024.  The patient was last seen 3 months ago for several symptoms. He is again accompanied by his mother who helps supplement the history today.  Records and images were personally reviewed where available. He is on Depakote  1000mg  in AM, 1500mg  in PM with last seizure in 02/2024 after he self-discontinued Depakote . On last visit, Gabapentin  increased to 600mg  BID.  I had the pleasure of seeing Todd Yoder in follow-up in the neurology clinic on 04/18/2024.  The patient was last seen 3 months ago for several symptoms. He is again accompanied by his mother who helps supplement the history today.  Records and images were personally reviewed where available. Since his last visit, he reports a nocturnal seizure at the end of August. He had self-discontinued the Depakote  for a week or so mid-August, then was spotty afterwards. Since the seizure, he is back to taking Depakote  1000mg  in AM, 1500mg  in PM regularly. He continues to have diffuse body pain, sometimes his feet are on fire. He reports pharmacy did not fill the updated Gabapentin  prescription, last refill in August was still 300mg  in AM, 600mg  in PM. He has seen Ortho and is awaiting Rheumatology evaluation. He is looking into Pain Management as well. He is looking for something to help him through the day a little better. He continues to have balance problems, he reports orthostatic vital signs were checked at Cardiology office and BP was noted to change with position. He was reporting tachycardia and was mailed a Zio monitor but it did not stick so he decided not to follow through. Last fall was 2 days ago when he stood up quickly because the water was boiling. No loss of consciousness, he felt dizzy but ignored it. He feels more stable/settled on Lithium  1-1-2. He notes he starts shaking before his next dose of medications.    Since his last visit, they deny any seizures since January 2025. He continues on Depakote  1000mg  in AM, 1500mg  in PM. He has a lot of pain and takes Gabpaentin 300mg  in AM, 600mg  in PM without side effects. He continues to see Ortho, he states he was hurting for 2 days after the MRI. They report he is supposed to start PT for his shoulder, but Ortho also wants him to see a rheumatologist before seeing Pain Management. They reports quite a bit of stress ongoing currently, his grandfather is pretty sick, which stresses him out. The Lithium does help make days brighter. He continues to have falls, he had 2 falls last week, no lost of consciousness. He states that falls happen out of nowhere, he loses his balance. His mother feels he rushes too much, he states maybe I need to slow down.    History on Initial Assessment 12/18/2018: This is a very pleasant 39 year old right-handed man with a history of bipolar disorder presenting for evaluation of new onset seizures. The first seizure occurred in his sleep on 10/04/2018. He had been sleep deprived for 1-2 days with only 4 hours of sleep, he recalled feeling tired and had a little headache then woke up inside the ambulance. His had bit his lip/tongue. He was brought to [facility] where CBC, BMP, head CT without contrast were unremarkable except for slight cerebellar tonsillar ectopia. UDS positive for THC. He had another seizure on  11/25/2018 again in his sleep. He again had sleep deprivation since they were short staffed at work, he works as a LAWYER. He got off work at [employer] and started moving his things into storage. He had a little nip of scotch and brandy, lay down, then woke up to family pushing him to go to the hospital. If felt like his muscles had a rigorous workout. He was brought to [facility] and as he was getting an MRI, he recalls hyperventilating when his head was put in the helmet vise, then he woke  up 5 hours later with shoulder pain R>L. He was reportedly foaming at the mouth with tongue bite, with a convulsive seizure lasting a couple of minutes. He had an MRI brain without contrast which I personally reviewed, no acute changes, hippocampi symmetric with no abnormal signal. 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 His wake and sleep EEG was normal. He was discharged home on Keppra  but stopped it after a week because it was making him hostile. His mother takes Depakote  so he had been taking his mother's Depakote  500mg  BID since then and denies any further convulsions.  He reports recurrent symptoms since 2018 when he started having panic attacks out of nowhere. He would start sweating and fell weak, like he would fall. Standing in front of a fan seemed to help, it would last 5-6 minutes then recur for several times. He has also been having a sensation running up and down his chest and back, an uncontrollable cool sensation that has quieted down some since starting medication. He recalls an episode at age 71 when he was driving to [facility] and it felt like his whole body paused, then he snapped back with the car still in motion. He attributed this to smoking marijuana. He denies any staring/unresponsive episodes, gaps in time, olfactory/gustatory hallucinations, focal numbness/tingling/weakness, myoclonic jerks. Since Sunday, he would start having a pounding headache right before he takes his dose of Depakote . He denies any dizziness, diplopia, neck/back pain. No side effects on Depakote .   Epilepsy Risk Factors:  His maternal grandfather, mother, maternal aunt, and maternal cousin have seizures. Otherwise he had a normal birth and early development.  There is no history of febrile convulsions, CNS infections such as meningitis/encephalitis, significant traumatic brain injury, neurosurgical  procedures.  Diagnostic Data: MRI brain without contrast in 11/2018 showed no acute changes, hippocampi symmetric with no abnormal signal. 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   MRI brain with and without contrast done 12/2023 normal.  His wake and sleep EEG in 11/2018 was normal.  48-hour EEG in 02/2020 showed frequent right anterior temporal epileptiform discharges exclusively in sleep. There were 3 electrographic seizures captured arising from the right temporal region lasting 70 seconds to 2.5 minutes with no clinical symptoms reported.  1-hour EEG in 11/2023 showed frequent right frontotemporal epileptiform discharges in sleep.   He was reporting swallowing difficulties and had a swallow evaluation in 11/2023 with overall functional oropharyngeal swallow. There was impairment in swallow efficacy with slowed and reduced base of tongue retraction, outpatient speech therapy was recommended.   Prior ASMs: Keppra  (hostile)   PAST MEDICAL HISTORY: Past Medical History:  Diagnosis Date   Bipolar disorder (HCC)    Family history of adverse reaction to anesthesia    problem with  aunt waking up after surgery   GERD (gastroesophageal reflux disease)    Seizures (HCC)     MEDICATIONS: Medications Ordered Prior to Encounter[1]  ALLERGIES: Allergies[2]  FAMILY HISTORY: Family History  Problem Relation Age of Onset   Diabetes Mother    Hypertension Mother    Sarcoidosis Mother    Atrial fibrillation Mother    Hypertension Father    Diabetes Maternal Grandfather    Atrial fibrillation Maternal Grandfather    Hypertension Paternal Grandfather    Atrial fibrillation Paternal Grandfather    Parkinson's disease Paternal Grandfather     SOCIAL HISTORY: Social History   Socioeconomic History   Marital status: Single    Spouse name: Not on file   Number of children: Not on  file   Years of education: Not on file   Highest education level: Not on file  Occupational History   Occupation: CNA  Tobacco Use   Smoking status: Former    Current packs/day: 0.00    Average packs/day: 0.3 packs/day    Types: Cigarettes    Quit date: 01/24/2021    Years since quitting: 3.4   Smokeless tobacco: Never   Tobacco comments:    smokes cigarettes occasionally/ cigars  Vaping Use   Vaping status: Former  Substance and Sexual Activity   Alcohol use: Not Currently    Comment: occasional   Drug use: Yes    Types: Marijuana    Comment: 3 a week   Sexual activity: Not Currently  Other Topics Concern   Not on file  Social History Narrative   Right handed    One level home   Highest level of edu- some college   Caffeine none   Not working    Lives with family   Social Drivers of Health   Tobacco Use: Medium Risk (04/18/2024)   Patient History    Smoking Tobacco Use: Former    Smokeless Tobacco Use: Never    Passive Exposure: Not on Actuary Strain: Not on file  Food Insecurity: Not on file  Transportation Needs: Not on file  Physical Activity: Not on file  Stress: Not on file  Social Connections: Not on file  Intimate Partner Violence: Not on file  Depression (PHQ2-9): Not on file  Alcohol Screen: Not on file  Housing: Not on file  Utilities: Not on file  Health Literacy: Not on file     PHYSICAL EXAM: There were no vitals filed for this visit. General: No acute distress Head:  Normocephalic/atraumatic Skin/Extremities: No rash, no edema Neurological Exam: alert and oriented to person, place, and time. No aphasia or dysarthria. Fund of knowledge is appropriate.  Recent and remote memory are intact.  Attention and concentration are normal.   Cranial nerves: Pupils equal, round. Extraocular movements intact with no nystagmus. Visual fields full.  No facial asymmetry.  Motor: Bulk and tone normal, muscle strength 5/5 throughout with no  pronator drift.   Finger to nose testing intact.  Gait narrow-based and steady, able to tandem walk adequately.  Romberg negative.   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 He had been seizure-free for 8 months until 02/2024 due to medication noncompliance. He is back to taking Depakote  1000mg  in AM, 1500mg  in PM. Proceed with increasing Gabapentin  to 600mg  BID to help with pain, agree with Pain Management evaluation. He continues to report frequent falls, MRI brain normal. Balance therapy  will be ordered. He is frustrated with inability to work, information for the [facility] [place] was provided to help with resources for avenues for work. He is aware of [place] driving laws to stop driving after a seizure until 6 months seizure-free. Follow-up in 3-4 months, call for any changes.      Thank you for allowing me to participate in *** care.  Please do not hesitate to call for any questions or concerns.  The duration of this appointment visit was *** minutes of face-to-face time with the patient.  Greater than 50% of this time was spent in counseling, explanation of diagnosis, planning of further management, and coordination of care.   Darice Shivers, M.D.   CC: ***      [1]  Current Outpatient Medications on File Prior to Visit  Medication Sig Dispense Refill   cyclobenzaprine  (FLEXERIL ) 5 MG tablet Take 1 tablet (5 mg total) by mouth every 8 (eight) hours as needed for muscle spasms. 30 tablet 11   divalproex  (DEPAKOTE  ER) 500 MG 24 hr tablet Take 2 tabs in AM, 3 tabs in PM 450 tablet 3   gabapentin  (NEURONTIN ) 300 MG capsule Take 2 capsules in AM, 2 capsules in PM 360 capsule 3   lithium carbonate 300 MG capsule Take 300 mg by mouth 3 (three) times daily.     Pseudoeph-Doxylamine-DM-APAP (NYQUIL PO) Take 1 Dose by mouth daily as needed (Itchy Throat).     No current facility-administered medications on file  prior to visit.  [2]  Allergies Allergen Reactions   Brazil Nut (Berthollefia Rosanne)    Cipro  [Ciprofloxacin  Hcl]     Burn with urination    Tramadol  Nausea And Vomiting   Augmentin [Amoxicillin-Pot Clavulanate] Rash and Other (See Comments)    Has patient had a PCN reaction causing immediate rash, facial/tongue/throat swelling, SOB or lightheadedness with hypotension: No Has patient had a PCN reaction causing severe rash involving mucus membranes or skin necrosis: Yes Has patient had a PCN reaction that required hospitalization Yes Has patient had a PCN reaction occurring within the last 10 years: No If all of the above answers are NO, then may proceed with Cephalosporin use.    Nickel Rash    Burning    Silver Rash    Burning    "

## 2024-07-24 ENCOUNTER — Encounter: Payer: Self-pay | Admitting: Neurology

## 2024-07-24 ENCOUNTER — Telehealth: Payer: Self-pay | Admitting: Neurology

## 2024-07-24 ENCOUNTER — Ambulatory Visit: Admitting: Neurology

## 2024-07-24 NOTE — Telephone Encounter (Signed)
 Pt had transportation was called in to work, appt cnld same day and would like to be placed on waitlist

## 2024-07-25 ENCOUNTER — Telehealth: Payer: Self-pay | Admitting: Neurology

## 2024-07-25 DIAGNOSIS — G40009 Localization-related (focal) (partial) idiopathic epilepsy and epileptic syndromes with seizures of localized onset, not intractable, without status epilepticus: Secondary | ICD-10-CM

## 2024-07-25 NOTE — Telephone Encounter (Signed)
 Can he come in for a Depakote  level? Thanks

## 2024-07-25 NOTE — Telephone Encounter (Signed)
 Pt will come in on Monday to have his depakote  level done,

## 2024-07-25 NOTE — Telephone Encounter (Signed)
 Pt c/o: seizure Missed medications?  No. Sleep deprived?  Yes.   Not getting as much getting 4-6 hours  Alcohol intake?  No. Increased stress? No. And yes stress over disability  Any change in medication color or shape? No. Back to their usual baseline self?  Yes.  . If no, advise go to ER Current medications prescribed by Dr. Georjean: divalproex  (DEPAKOTE  ER) 500 MG 24 hr tablet  Take 2 tabs in AM, 3 tabs in PM   Pt stated that last he called because he said he felt his head you could feel and see his forehead jumping. He said that he didn't make much since talking. He said he closed he eyes and it looked like christmas lights or like he was at the casino seeing the lights. He said the sensations started about a month ago, he wanted Dr Georjean to know, it started in the front went to the middle, felt like lighting then went to vibrations it just felt weird. It was not a gra mil seizure. He was moody, he said that his mom felt the vibrations and told him to call,

## 2024-07-25 NOTE — Telephone Encounter (Signed)
 Pt. Cld 07/24/24 5:51pm Access Nurse Caller states he has had series of gran mal Seizures, Feels like vibration, dizziness and flashing lights. The pulsating makes his balance off and he becomes wobbly and almost fell.Took Depakote 

## 2024-07-25 NOTE — Addendum Note (Signed)
 Addended by: TAFT MOATS on: 07/25/2024 02:01 PM   Modules accepted: Orders

## 2025-01-28 ENCOUNTER — Ambulatory Visit: Payer: Self-pay | Admitting: Neurology
